# Patient Record
Sex: Female | Born: 1985 | Race: Black or African American | Hispanic: No | Marital: Married | Smoking: Never smoker
Health system: Southern US, Community
[De-identification: ages and names within clinical notes are randomized; demographics above are authoritative.]

## PROBLEM LIST (undated history)

## (undated) DIAGNOSIS — Z789 Other specified health status: Secondary | ICD-10-CM

---

## 2004-10-08 ENCOUNTER — Encounter: Admission: RE | Admit: 2004-10-08 | Discharge: 2004-10-08 | Payer: Self-pay | Admitting: Specialist

## 2005-10-04 ENCOUNTER — Ambulatory Visit (HOSPITAL_COMMUNITY): Admission: RE | Admit: 2005-10-04 | Discharge: 2005-10-04 | Payer: Self-pay | Admitting: *Deleted

## 2006-05-29 ENCOUNTER — Inpatient Hospital Stay (HOSPITAL_COMMUNITY): Admission: AD | Admit: 2006-05-29 | Discharge: 2006-06-03 | Payer: Self-pay | Admitting: Specialist

## 2006-05-29 ENCOUNTER — Ambulatory Visit: Payer: Self-pay | Admitting: Obstetrics and Gynecology

## 2006-05-30 ENCOUNTER — Encounter (INDEPENDENT_AMBULATORY_CARE_PROVIDER_SITE_OTHER): Payer: Self-pay | Admitting: Specialist

## 2006-06-05 ENCOUNTER — Ambulatory Visit: Payer: Self-pay | Admitting: Family Medicine

## 2007-08-06 IMAGING — CT CT PELVIS W/ CM
1 of 9 series · 11 of 32 positions shown, 17 images · IV contrast ([ID] READICAT & [ID] OMNIP 300%)
Comparison: No comparison films.

CLINICAL DATA: Status post C-section two days ago.  Distended abdomen with continued gross hematuria.
ABDOMEN CT WITH CONTRAST:
TECHNIQUE: Multidetector CT imaging of the abdomen was performed following the standard protocol during bolus administration of intravenous contrast.
Contrast:  099cc Omnipaque 300.
TECHNIQUE: Multidetector CT imaging of the pelvis was performed following the standard protocol during bolus administration of intravenous contrast.

[Series 4: recon 3: abd pelvis · axial · 0.72mm/px · z∈[-394,-74]mm · 11 of 385 slices shown, 17 images]
[im 33/385  soft-tissue]
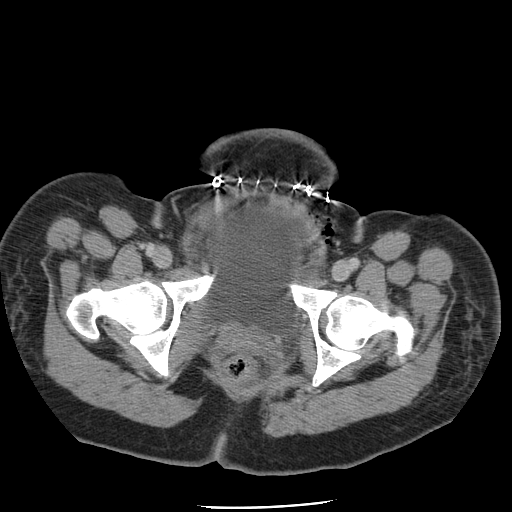
[im 33/385  bone]
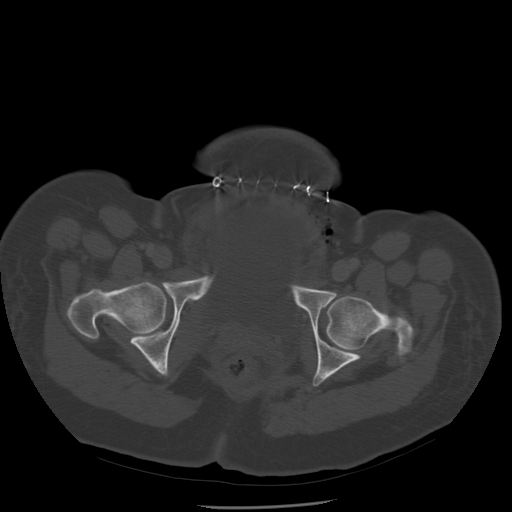
[im 65/385  soft-tissue]
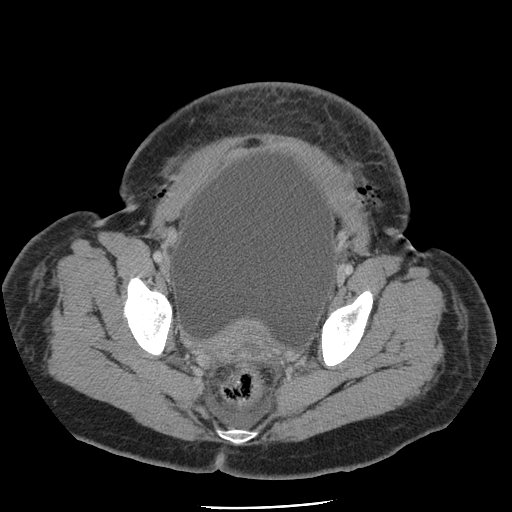
[im 97/385  soft-tissue]
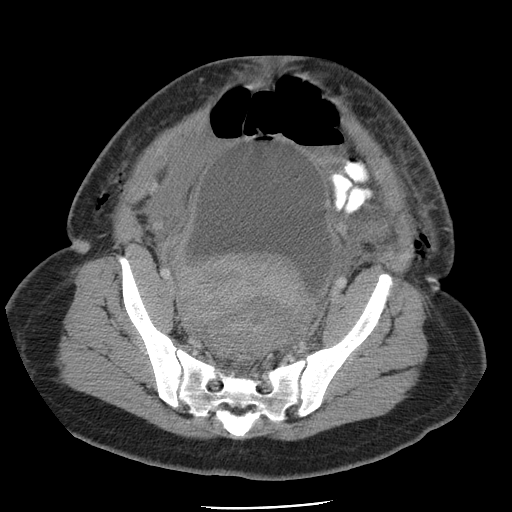
[im 129/385  soft-tissue]
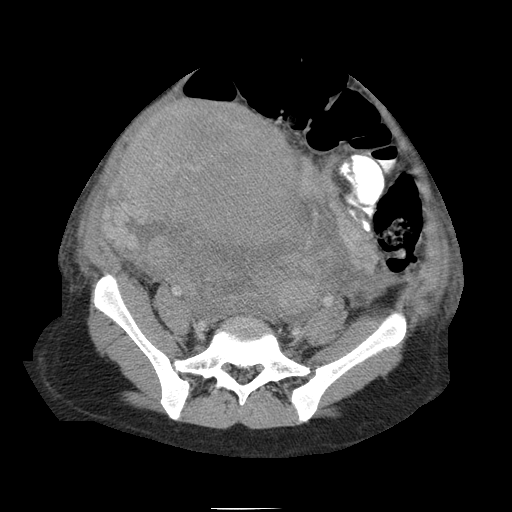
[im 161/385  soft-tissue]
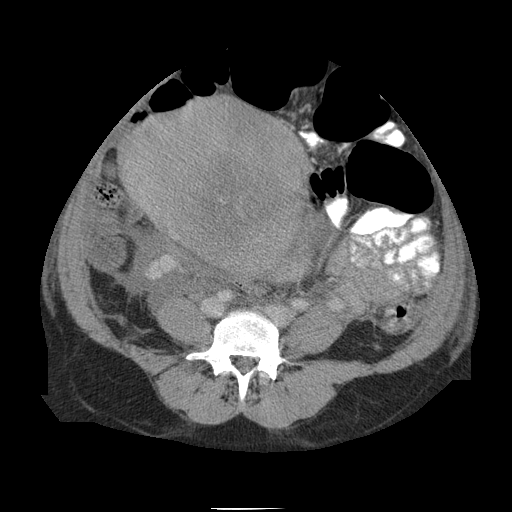
[im 193/385  soft-tissue]
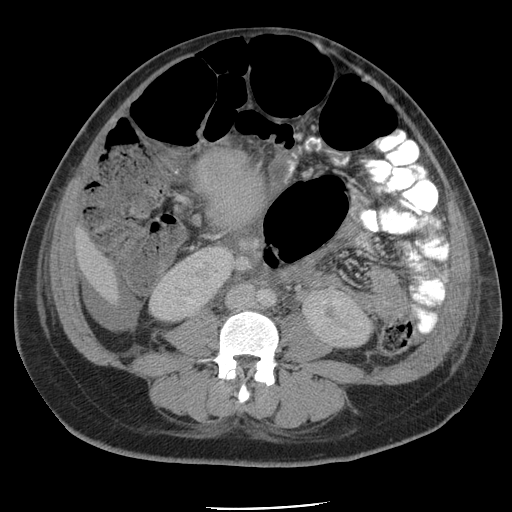
[im 225/385  soft-tissue]
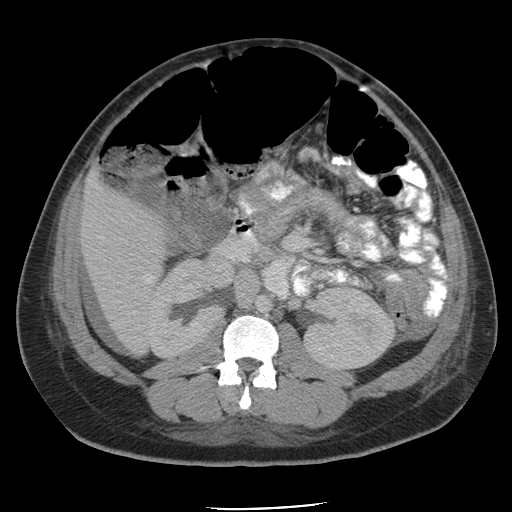
[im 257/385  soft-tissue]
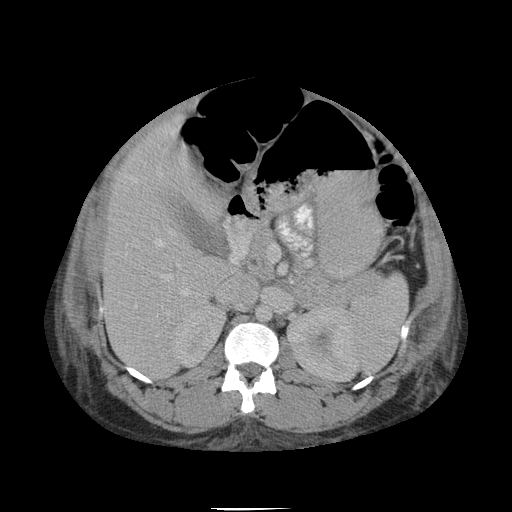
[im 257/385  lung]
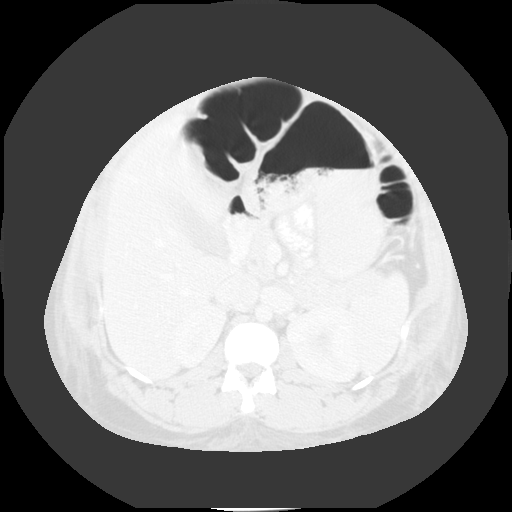
[im 289/385  soft-tissue]
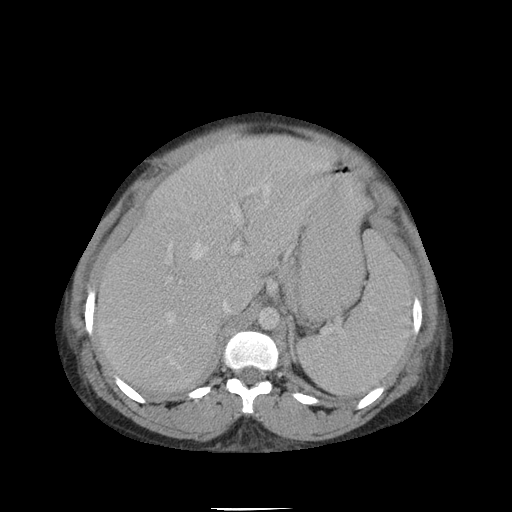
[im 289/385  lung]
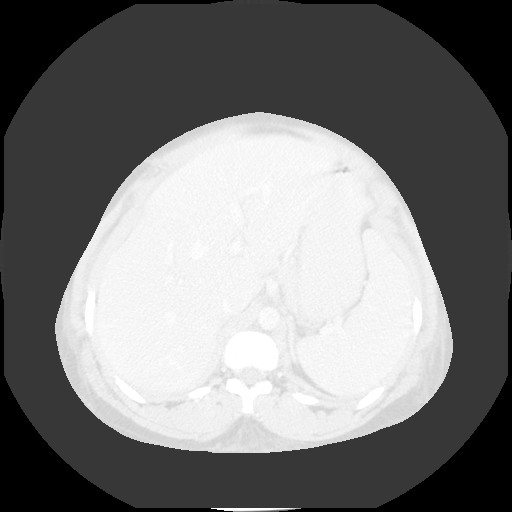
[im 289/385  bone]
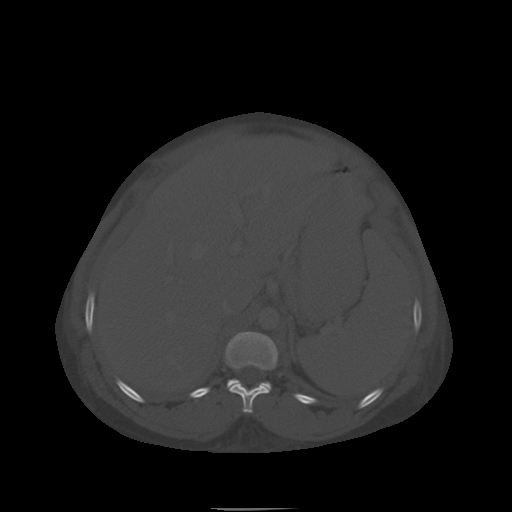
[im 321/385  soft-tissue]
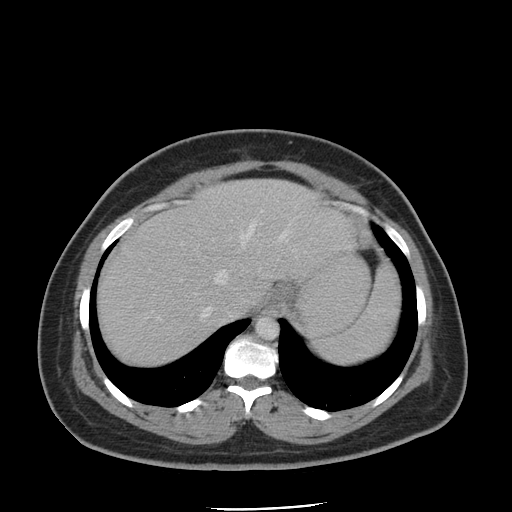
[im 321/385  lung]
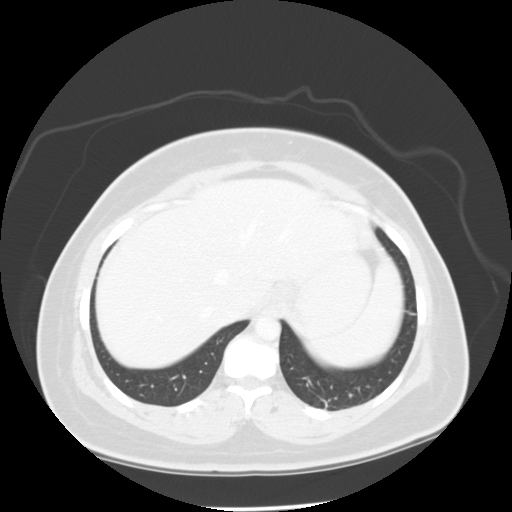
[im 353/385  soft-tissue]
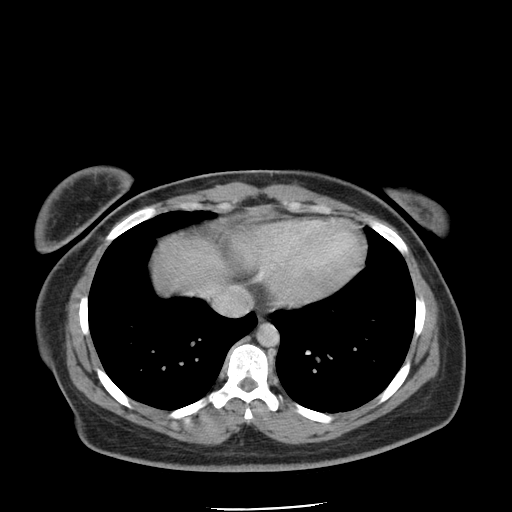
[im 353/385  lung]
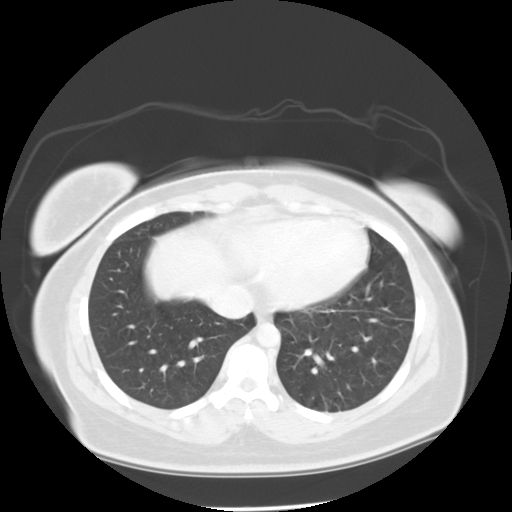

[11 of 32 positions shown; findings below may reference images not displayed]

FINDINGS: The liver, spleen, pancreas, adrenal glands and gallbladder are within normal limits.  Mild to moderate bilateral hydronephrosis and mild hydroureter is compatible with recent pregnancy.  There is a small amount of ascites present, likely postoperative.  A tiny amount of pneumoperitoneum is identified and again likely postoperative.  Enlarged heterogeneous uterus is compatible with recent pregnancy.  There is moderate gaseous distention of the mid colon compatible with ileus.  No dilated small bowel loops are identified.
IMPRESSION: 1. Colonic ileus. 
2. Small to moderate ascites and tiny amount of pneumoperitoneum compatible with recent C-section.
3. Mild to moderate bilateral hydronephrosis compatible with hydronephrosis of pregnancy.
PELVIS CT WITH CONTRAST:
FINDINGS: Enlarged heterogeneous uterus is identified.  Small amount of gas in the bladder is noted.  Contrast throughout the ureters and bladder is noted without extravasated urinary contrast on delayed images.  Gas in the subcutaneous tissues is compatible with recent C-section.  Small amount of free fluid in the pelvis is noted.
IMPRESSION: 1. No evidence of ureteral or bladder injury.  
2. Postsurgical changes within the pelvis, status post C-section.

## 2009-02-26 ENCOUNTER — Ambulatory Visit (HOSPITAL_COMMUNITY): Admission: AD | Admit: 2009-02-26 | Discharge: 2009-02-26 | Payer: Self-pay | Admitting: Obstetrics

## 2009-02-26 ENCOUNTER — Encounter (INDEPENDENT_AMBULATORY_CARE_PROVIDER_SITE_OTHER): Payer: Self-pay | Admitting: Obstetrics

## 2010-12-02 LAB — DIFFERENTIAL
Eosinophils Absolute: 0.2 10*3/uL (ref 0.0–0.7)
Eosinophils Relative: 2 % (ref 0–5)
Lymphocytes Relative: 33 % (ref 12–46)
Lymphs Abs: 2.7 10*3/uL (ref 0.7–4.0)
Monocytes Relative: 6 % (ref 3–12)

## 2010-12-02 LAB — CBC
HCT: 35.9 % — ABNORMAL LOW (ref 36.0–46.0)
MCV: 78.6 fL (ref 78.0–100.0)
Platelets: 193 10*3/uL (ref 150–400)
RBC: 4.57 MIL/uL (ref 3.87–5.11)
WBC: 8.2 10*3/uL (ref 4.0–10.5)

## 2011-01-11 NOTE — Discharge Summary (Signed)
NAMEBECKY, COLAN NO.:  [id number]   MEDICAL RECORD NO.:  [id number]          PATIENT TYPE:  INP   LOCATION:  9320                          FACILITY:  [facility]   PHYSICIAN:  Ginger Carne, MD  DATE OF BIRTH:  05-03-1986   DATE OF ADMISSION:  05/29/2006  DATE OF DISCHARGE:  06/03/2006                                 DISCHARGE SUMMARY   DISCHARGE DIAGNOSES:  1. Gravida 1, para1, status post primary low transverse cesarean section      secondary to failed induction for oligohydramnios, pre-eclampsia.  2. Pre-eclampsia.  3. Postpartum anemia.  4. Postop ileus.   CONSULTATIONS:  None.   PROCEDURE:  1. Biophysical profile and non stress test on 05/29/06 demonstrating BPP      of 8/8 and a nonreactive strip.  2. Amnio infusion.  3. Induction of labor with Pitocin.  4. Primary low transverse cesarean section on 05/30/06.  5. CT of the abdomen and pelvis with contrast on 06/01/06 demonstrating      colonic ileus, small to moderate ascites and tiny amount of      pneumoperitoneum compatible with recent C-section, mild to moderate      bilateral hydronephrosis compatible with recent pregnancy; no evidence      of ureteral or bladder injury, post surgical changes, status post C-      section noted.   ADMISSION AND PRENATAL LABS:  1. Blood type 0+.  2. Antibody screen negative.  3. Hemoglobin 11.8.  4. Platelets 202.  5. Rubella immune.  6. Hepatitis B surface antigen negative.  7. RPR nonreactive.  8. HIV declined.  9. GC negative.  10.Chlamydia negative.  11.GVS negative on 05/06/06.  12.One hour GCT of 126.   INPATIENT LABS:  1. Hemoglobin of 13.3 on admission and 8.2 on 05/31/06, following a C-      section, low of 6.4 on 06/02/06 and 6.8 on date of discharge.  2. Platelets 179 on admission, 143 on 05/31/06 and 188 at discharge.  3. Urine culture negative.  4. Urinalysis on 06/01/06 demonstrates large blood, urobilinogen 2.0, and      21-50 RBC's,  but otherwise within normal limits (cath specimen).  5. Creatinine 0.9 on 05/30/06.  6. AST 21 and ALT 10 on 05/30/06.  7. Alk phos 150 on 05/30/06.  8. Total bilirubin 1.3 on 05/30/06.  9. LDH 158 and uric acid 4.9 on 05/30/06.   ADMISSION HISTORY:  The patient is a 25 year old G1, now P2, who presented  at [redacted] weeks gestational age by LMP and six week scan with complaints of  contractions for the past three days with increasing intensity.  She denied  any vaginal bleeding or ruptured membranes and reported good fetal activity  at that time.  The patient was an [employer] patient.  She was found to be  in latent labor initially with cervical exam of 250 and -1 and with  reassuring fetal heart tracing.  She was having contractions every three  minutes, was otherwise doing well with blood pressure of 126/88 and afebrile  on admission.  She was observed in the MAU for several hours as her strip became somewhat  less reactive.  She was eventually admitted to Labor and Delivery for IV  fluids and for induction of labor, as she was also found to have  oligohydramnios on ultrasound.   HOSPITAL COURSE:  The patient had a protracted labor course as she had amnio  infusion and was induced on Pitocin.  Artificial rupture of membranes was  performed on 05/29/06 with light meconium and IUPC and FSC were placed  subsequently.  She then developed elevated blood pressures in the 150s to  160s over 90s to 100s on the morning of 05/30/06.  PIH labs and urine  protein levels were initiated and the patient was started on magnesium IV  for presumptive pre-eclampsia.  Pitocin induction was continued for several  more hours until the decision was made to proceed to C-section secondary to  fetal jeopardy and poor tolerance of labor by patient; she had been making  poor cervical change and her contractions were not adequate with the  dysfunctional pattern, baby was suspected to be OP and deflexed.   The  patient was taken to the operating room and primary low transverse  cesarean section was performed without difficulty. Please see Dr. Serita Kyle  completed dictated op note for complete details.   Following delivery, patient appeared medically stable throughout her  postpartum course. She did have some complaints of abdominal pain and  abdominal distention and had some hematuria noted, but otherwise felt fine  and was breastfeeding well without difficulty. She ambulated immediately and  was completely asymptomatic with regards to her anemia. The patient was  found to have a hemoglobin of 8.2 the morning after delivery and her  hemoglobin continued to drop over the ensuing two days with a low of 6.4 on  06/02/06, but had begun to rise on the date of discharge. The patient did  not desire a blood transfusion and as she was completely asymptomatic from  her anemia, the decision was to simply monitor her hemoglobin levels and  monitor her symptoms. She was discharged on twice daily iron and will be  seen in clinic in three days for staple removal, at which time she will also  be reassessed for any signs or symptoms of anemia or hypokalemia.   The patient was also suffering from moderate abdominal distention secondary  to bowel gas.  Her abdomen remained tympanitic on physical exam  postoperatively, even at the time of discharge but was significantly  improved after initiating Dulcolax suppositories in addition to her Colace  and Senna. The patient was passing flatus without difficulty for the last  three days prior to discharge and was able to pass a small bowel movement on  the day of discharge.  She did have some postop ileus noted on the CT scan,  as well, but her abdominal exam was otherwise benign and was not felt to be  in need of further evaluation at the time of discharge by Dr. Mia Creek.  The patient was noted to have some hematuria postoperatively and while she  had a Foley in place,  however this continued even after the Foley was  removed and further studies were carried out.  Urinalysis of repeat cath  sample demonstrated large blood and elevated urobilinogen, although the  sample was not grossly bloody as her previous urine samples had been.  Due  to concern for possible injury to the bladder or ureters during her C-  section,  CT of the abdomen and pelvis with contrast was obtained per  recommendation by radiology to assess for possible damage to these  structures and to also assess for possible causes for abdominal bleeding in  light of her worsening anemia.  CT scan results are as detailed above.  Some  free fluid was noted in the CT scan, but was not consistent with fresh  bleeding and was felt most likely to be residual irrigation from the  procedure that had not been completely suctioned out.  It is mostly located  in the right pericolic gutter as the patient was clinically asymptomatic and  stable, a decision was not to proceed with further workup.   On the date of discharge, the patient did spike a temperature of 100.7 in  the morning, but was without other complaints. Repeat temperature checks  later in the day, revealed her temperature to have dropped back to within  normal limits and she was felt to be appropriate for discharge.  She was not  discharged on any antibiotics as her white count was normal and she was not  having other symptoms.   The patient will return to clinic here on Friday in three days for removal  of her surgical staples and will be reassessed with regards to her anemia  and hematuria.  She will then follow-up in six weeks for her routine  postpartum follow-up.   DISCHARGE CONDITION:  Fair.   DISPOSITION:  The patient is discharged to home with her female infant.   DISCHARGE MEDICATIONS:  1. Ibuprofen 600 mg p.o. q. 6 hours p.r.n. pain.  2. Percocet 5/325 mg one tab p.o. q. 4-6 hours p.r.n. breakthrough pain,      dispense number  20 with no refills.  3. Colace 100 mg p.o. twice daily p.r.n. constipation.  4. Iron sulfate 325 mg one tab by mouth twice daily between meals.  5. Prenatal vitamins one tab p.o. daily.   DISCHARGE INSTRUCTIONS:  The patient is to resume activity as tolerated but  to exercise pelvic rest for the next six weeks.   FOLLOWUP:  The patient is to follow-up in [facility] here on Friday,  06/06/06 for staple removal and will have routine postpartum follow-up at  [facility] in six weeks.  She will use condoms/abstinence for  contraception for the time being.     ______________________________  Carollee Herter, DO      Ginger Carne, MD  Electronically Signed    EC/MEDQ  D:  06/03/2006  T:  06/05/2006  Job:  [id number]

## 2011-01-11 NOTE — Op Note (Signed)
[name], Stefanie Hernandez               ACCOUNT NO.:  [id number]   MEDICAL RECORD NO.:  [id number]          PATIENT TYPE:  INP   LOCATION:  9167                          FACILITY:  [facility]   PHYSICIAN:  Phil D. Okey Dupre, M.D.     DATE OF BIRTH:  27-Jun-1986   DATE OF PROCEDURE:  05/30/2006  DATE OF DISCHARGE:                                 OPERATIVE REPORT   PROCEDURE:  Low-transverse cesarean section.   PREOPERATIVE DIAGNOSIS:  Fetal jeopardy and preeclampsia.   POSTOPERATIVE DIAGNOSIS:  Fetal jeopardy and preeclampsia.   SURGEON:  Javier Glazier. Okey Dupre, M.D.   FIRST ASSISTANT:  Carollee Herter, DO.   ANESTHESIA:  Epidural.   SPECIMENS:  Placenta to pathology.   POSTOPERATIVE CONDITION:  Satisfactory.   INDICATIONS FOR PROCEDURE:  The patient is a 25 year old primigravida at  full dilatation with 4+ caput and direct posterior who began having  significant decelerations with pushing and slower recovery.  It was decided  to do a cesarean section at that point.   DESCRIPTION OF PROCEDURE:  Under satisfactory epidural anesthesia with the  patient in the dorsal supine position and a Foley catheter in urinary  bladder, the abdomen was prepped and draped in usual sterile manner and  entered through a transverse incision situated 3 cm above the symphysis  pubis and extended for a total length of 18 cm.  The abdomen was entered by  layers.  On entering the peritoneal cavity, the visceral peritoneum on the  anterior surface of the uterus was opened transversely by sharp dissection.  The bladder was pushed away the lower uterine segment and was entered by  sharp and blunt dissection.  From a direct OP presentation, the baby was  easily delivered and had a cord pH of 7.21.  The cord was doubly clamped and  divided, and the baby was handed to the pediatrician.  Samples of blood were  taken from the cord for analysis.  The placenta was spontaneously removed.  The uterus was explored and closed with continuous  running locked 0 Vicryl  suture on an atraumatic needle followed by an imbricating suture of the same  type of suture.  The area was observed for bleeding.  The ovaries and tubes  were normal, and the fascia was closed with continuous running 0 Vicryl on  an atraumatic needle.  The skin and subcutaneous bleeders were controlled  with hot cautery.  Dry sterile dressings were applied after the skin edges  were approximated with skin staples.  The patient had a total blood loss of  approximately 500 mL.   She tolerated the procedure well and was transferred to the recovery room in  satisfactory condition, with a Foley catheter draining clear amber urine at  the end of the procedure.           ______________________________  Javier Glazier. Okey Dupre, M.D.     PDR/MEDQ  D:  05/30/2006  T:  06/01/2006  Job:  [id number]

## 2011-02-10 ENCOUNTER — Other Ambulatory Visit: Payer: Self-pay | Admitting: Obstetrics & Gynecology

## 2011-02-10 ENCOUNTER — Inpatient Hospital Stay (HOSPITAL_COMMUNITY)
Admission: AD | Admit: 2011-02-10 | Discharge: 2011-02-13 | DRG: 766 | Disposition: A | Discharge status: Home / Self Care | Payer: Medicaid Other | Source: Ambulatory Visit | Attending: Obstetrics | Admitting: Obstetrics

## 2011-02-10 DIAGNOSIS — O34219 Maternal care for unspecified type scar from previous cesarean delivery: Secondary | ICD-10-CM | POA: Diagnosis present

## 2011-02-10 LAB — CBC
HCT: 32.6 % — ABNORMAL LOW (ref 36.0–46.0)
MCH: 26.3 pg (ref 26.0–34.0)
MCV: 71.3 fL — ABNORMAL LOW (ref 78.0–100.0)
Platelets: 175 10*3/uL (ref 150–400)
RBC: 4.57 MIL/uL (ref 3.87–5.11)
RDW: 15.5 % (ref 11.5–15.5)
WBC: 8.6 10*3/uL (ref 4.0–10.5)

## 2011-02-11 LAB — CBC
Hemoglobin: 9.9 g/dL — ABNORMAL LOW (ref 12.0–15.0)
MCH: 25.8 pg — ABNORMAL LOW (ref 26.0–34.0)
MCHC: 36 g/dL (ref 30.0–36.0)
MCV: 71.6 fL — ABNORMAL LOW (ref 78.0–100.0)
RBC: 3.84 MIL/uL — ABNORMAL LOW (ref 3.87–5.11)

## 2011-02-13 NOTE — Discharge Summary (Signed)
  NAMEVANILLA, HEATHERINGTON NO.:  [id number]  MEDICAL RECORD NO.:  [id number]  LOCATION:  9101                          [facility]ACILITY:  [facility]  PHYSICIAN:  Kathreen Cosier, M.D.DATE OF BIRTH:  Oct 04, 1985  DATE OF ADMISSION:  02/10/2011 DATE OF DISCHARGE:  02/13/2011                              DISCHARGE SUMMARY   The patient is a 25 year old gravida 3, para 1-0-1-1 with a previous C- section.  Her due date was February 22, 2011 and she was admitted in labor and underwent repeat low transverse cesarean section because of failure to progress in labor.  Postoperatively, she did well.  Her hemoglobin was 9.9.  HIV negative, RPR negative.  She was discharged on the third postoperative day, ambulatory, on a regular diet, to see me in 6 weeks.  DISCHARGE DIAGNOSIS:  Status post repeat low transverse cesarean section at term.          ______________________________ Kathreen Cosier, M.D.     BAM/MEDQ  D:  02/13/2011  T:  02/13/2011  Job:  [id number]  Electronically Signed by Francoise Ceo M.D. on 02/13/2011 09:01:49 AM

## 2011-02-18 NOTE — Op Note (Signed)
NAMEAMYRE, Stefanie Hernandez NO.:  [id number]  MEDICAL RECORD NO.:  [id number]  LOCATION:  9101                          [facility]ILITY[facility]  PHYSICIAN:  Roseanna Rainbow, M.D.DATE OF BIRTH:  [birth year]  DATE OF PROCEDURE:  02/10/2011 DATE OF DISCHARGE:                              OPERATIVE REPORT   PREOPERATIVE DIAGNOSES:  Intrauterine pregnancy at 38 plus weeks, history of previous cesarean delivery, early labor, category II fetal heart tracing.  POSTOPERATIVE DIAGNOSES:  Intrauterine pregnancy at 38 plus weeks, history of previous cesarean delivery, early labor, category II fetal heart tracing, uterine scar dehiscence.  PROCEDURE:  Repeat low uterine flap elliptical cesarean delivery via transverse incision.  SURGEON:  Roseanna Rainbow, MD  ANESTHESIA:  Spinal.  PATHOLOGY:  Placenta.  ESTIMATED BLOOD LOSS:  600 mL.  COMPLICATIONS:  None.  IV FLUIDS AND URINE OUTPUT:  As per Anesthesiology.  FINDINGS:  At laparotomy, there was a window in the serosa overlying the previous scar at the left margin approximately 2 cm in length.  The amniotic fluid was meconium stained with particulate matter.  The placenta and membranes were stained as well.  Umbilical artery pH was 7.30.  DESCRIPTION OF PROCEDURE:  The patient was taken to the operating room with an IV running.  A Foley catheter was then placed.  A spinal anesthetic was administered.  She was placed in the dorsal supine position with a leftward tilt and prepped and draped in the usual sterile fashion.  After time-out had been completed the previous scar was incised with the scalpel.  This incision was extended down to the fascia.  The fascia was nicked in the midline.  The fascial incision was then extended bilaterally.  The superior aspect of the fascial incision was tented up and the underlying rectus muscles dissected off.  The inferior aspect of the fascial incision was manipulated in a  similar fashion.  The rectus muscles were separated in the midline.  The parietal peritoneum was tented up and entered sharply.  This incision was then extended superiorly and inferiorly with good visualization of the bladder.  The bladder blade was then placed.  The above-noted window was then probed and opened with a hemostat.  The remainder of the uterine incision was opened bluntly.  The infant's head was delivered atraumatically.  The cord was clamped and cut.  The infant was handed off to the awaiting neonatologist.  The placenta was then removed.  The intrauterine cavity was evacuated of any remaining amniotic fluid, clots, and debris with moist laparotomy sponge.  Please note that umbilical artery pH was sent.  The intrauterine cavity was evacuated of any remaining amniotic fluid, clots, and debris with moist laparotomy sponge.  The bladder flap was then created using sharp and blunt dissection.  The uterine incision was then reapproximated in a running interlocking fashion using a suture of 0 Monocryl.  A second imbricating layer of the same suture was placed.  Adequate hemostasis was noted. The paracolic gutters were then irrigated.  The parietal peritoneum was closed in a running fashion using a suture of 2-0 Vicryl.  The fascia was closed with two running sutures of O PDS  tied in the midline.  The skin was closed in a subcuticular fashion using a suture of 3-0 Monocryl.  At the close of the procedure, the instrument and pack counts were said to be correct x2.  The patient was taken to the PACU awake and in stable condition.     Roseanna Rainbow, M.D.     Stefanie Hernandez  D:  02/10/2011  T:  02/10/2011  Job:  [id number]  Electronically Signed by Antionette Char M.D. on 02/18/2011 10:55:08 AM

## 2011-02-18 NOTE — H&P (Signed)
  Stefanie Hernandez, HEMRICK NO.:  [id number]  MEDICAL RECORD NO.:  [id number]  LOCATION:  9101                          [facility]ILITY[facility]  [facility]  PHYSICIAN:  Roseanna Rainbow, M.D.DATE OF BIRTH:  Jul 06, 1986  DATE OF ADMISSION:  02/10/2011 DATE OF DISCHARGE:                             HISTORY & PHYSICAL   CHIEF COMPLAINT:  The patient is a 25 year old, para 1 with a history of previous cesarean delivery complaining of rupture of membranes and contractions.  HISTORY OF PRESENT ILLNESS:  Please see the above.  SOCIAL HISTORY:  She is single.  She denies any tobacco, ethanol, or drug use.  PAST GYNECOLOGICAL HISTORY:  Normal triad.  History of a female circumcision.  PAST OBSTETRIC HISTORY:  In 2006, she was delivered of a live born female with birth weight 8 pounds and 4 ounces, full term, cesarean delivery.  The indication was CPD.  There is a history of a spontaneous abortion.  PAST SURGICAL HISTORY:  Please see the above past medical history. History of a female circumcision.  FAMILY HISTORY:  Noncontributory.  MEDICATIONS:  Please see the medication reconciliation form.  ALLERGIES:  No known drug allergies.  ANTEPARTUM COURSE:  Prenatal care with Dr. Gaynell Hernandez, onset of care at 14 weeks.  PRENATAL LABORATORY DATA:  Hemoglobin 11.4, hematocrit 32, and platelets 205,000.  Blood type O positive, antibody screen negative, RPR nonreactive, rubella immune.  Hepatitis B surface antigen negative, HIV nonreactive.  Pap test negative.  GC and Chlamydia negative.  Quad screen negative.  One-hour GTT 104, RPR nonreactive.  Ultrasound at 16 weeks and 2 days, anterior placenta, no previa.  REVIEW OF SYSTEMS:  GU:  Please see the above.  PHYSICAL EXAMINATION:  VITAL SIGNS:  Stable, afebrile.  Fetal heart tracing baseline 140s.  Moderate long-term variability.  Accelerations present.  Severe variable decelerations, several and prolonged approximately 2 minutes in  duration with recovery.  No reflex tachycardia. GENERAL:  Very uncomfortable. GU:  Sterile vaginal exam, cervix is loose 2, 60% effaced, fetal scalp electrode and IUPC then placed.  ASSESSMENT:  Primipara at term, history of a previous cesarean delivery, early labor, category 2 fetal heart tracing.  Fetal heart tracing likely secondary to cord compression, cannot rule out signs of possible uterine rupture.  PLAN:  Admission, repeat cesarean delivery.  Informed consent was obtained.     Roseanna Rainbow, M.D.     [name]  D:  02/10/2011  T:  02/10/2011  Job:  [id number]  Electronically Signed by Antionette Char M.D. on 02/18/2011 10:55:29 AM

## 2012-01-31 LAB — OB RESULTS CONSOLE ANTIBODY SCREEN: Antibody Screen: NEGATIVE

## 2012-01-31 LAB — OB RESULTS CONSOLE ABO/RH: RH Type: POSITIVE

## 2012-01-31 LAB — OB RESULTS CONSOLE HEPATITIS B SURFACE ANTIGEN: Hepatitis B Surface Ag: NEGATIVE

## 2012-07-07 ENCOUNTER — Other Ambulatory Visit: Payer: Self-pay | Admitting: Obstetrics

## 2012-07-25 ENCOUNTER — Encounter (HOSPITAL_COMMUNITY): Payer: Self-pay | Admitting: Pharmacist

## 2012-07-29 ENCOUNTER — Encounter (HOSPITAL_COMMUNITY)
Admission: RE | Admit: 2012-07-29 | Discharge: 2012-07-29 | Disposition: A | Discharge status: Home / Self Care | Payer: Medicaid Other | Source: Ambulatory Visit | Attending: Obstetrics | Admitting: Obstetrics

## 2012-07-29 ENCOUNTER — Encounter (HOSPITAL_COMMUNITY): Payer: Self-pay

## 2012-07-29 HISTORY — DX: Other specified health status: Z78.9

## 2012-07-29 LAB — DIFFERENTIAL
Basophils Absolute: 0 10*3/uL (ref 0.0–0.1)
Basophils Relative: 0 % (ref 0–1)
Eosinophils Absolute: 0.1 10*3/uL (ref 0.0–0.7)
Eosinophils Relative: 2 % (ref 0–5)
Monocytes Absolute: 0.5 10*3/uL (ref 0.1–1.0)
Monocytes Relative: 6 % (ref 3–12)
Neutro Abs: 4.7 10*3/uL (ref 1.7–7.7)

## 2012-07-29 LAB — TYPE AND SCREEN

## 2012-07-29 LAB — CBC
HCT: 31.3 % — ABNORMAL LOW (ref 36.0–46.0)
Hemoglobin: 11.1 g/dL — ABNORMAL LOW (ref 12.0–15.0)
MCH: 24.6 pg — ABNORMAL LOW (ref 26.0–34.0)
MCHC: 35.5 g/dL (ref 30.0–36.0)
RDW: 16.9 % — ABNORMAL HIGH (ref 11.5–15.5)

## 2012-07-29 NOTE — H&P (Signed)
Stefanie Hernandez, Stefanie Hernandez NO.:  [id number]  MEDICAL RECORD NO.:  [id number]  LOCATION:  PERIO                         [facility]ILITY:  [facility]  PHYSICIAN:  Kathreen Cosier, M.D.DATE OF BIRTH:  1986-04-06  DATE OF ADMISSION:  05/15/2012 DATE OF DISCHARGE:                             HISTORY & PHYSICAL   DATE OF OPERATION:  August 05, 2012.  The patient is a 26 year old, gravida 4, para 2-0-1-2, [facility] August 08, 2012.  She has had 2 previous C-sections, 1 spontaneous abortion.  This pregnancy has been uncomplicated except for positive GBS and she is in for repeat C-section.  PAST MEDICAL HISTORY:  Negative.  PAST SURGICAL HISTORY:  C-section x2.  SOCIAL HISTORY:  Noncontributory.  FAMILY HISTORY:  Noncontributory.  SYSTEM REVIEW:  Negative.  PHYSICAL EXAMINATION:  GENERAL:  Well-developed female in no distress. HEENT:  Negative. LUNGS:  Clear to P and A. BREASTS:  Engorged. HEART:  Regular rhythm.  No murmurs, no gallops. LUNGS:  Clear. ABDOMEN:  Term. PELVIC:  Deferred. EXTREMITIES:  Negative.          ______________________________ Kathreen Cosier, M.D.     BAM/MEDQ  D:  07/29/2012  T:  07/29/2012  Job:  [id number]

## 2012-07-29 NOTE — Patient Instructions (Addendum)
20 Stefanie Hernandez  07/29/2012   Your procedure is scheduled on:  08/05/12  Enter through the Main Entrance of [facility] at 11AM   Pick up the phone at the desk and dial [phone].   Call this number if you have problems the morning of surgery: [phone]   Remember:   Do not eat food:After Midnight.  Do not drink clear liquids: After Midnight.  Take these medicines the morning of surgery with A SIP OF WATER: NA   Do not wear jewelry, make-up or nail polish.  Do not wear lotions, powders, or perfumes. You may wear deodorant.  Do not shave 48 hours prior to surgery.  Do not bring valuables to the hospital.  Contacts, dentures or bridgework may not be worn into surgery.  Leave suitcase in the car. After surgery it may be brought to your room.  For patients admitted to the hospital, checkout time is 11:00 AM the day of discharge.   Patients discharged the day of surgery will not be allowed to drive home.  Name and phone number of your driver: NA  Special Instructions: Shower using CHG 2 nights before surgery and the night before surgery.  If you shower the day of surgery use CHG.  Use special wash - you have one bottle of CHG for all showers.  You should use approximately 1/3 of the bottle for each shower.   Please read over the following fact sheets that you were given: MRSA Information

## 2012-08-05 ENCOUNTER — Encounter (HOSPITAL_COMMUNITY): Payer: Self-pay | Admitting: *Deleted

## 2012-08-05 ENCOUNTER — Inpatient Hospital Stay (HOSPITAL_COMMUNITY): Payer: Medicaid Other | Admitting: Anesthesiology

## 2012-08-05 ENCOUNTER — Inpatient Hospital Stay (HOSPITAL_COMMUNITY)
Admission: AD | Admit: 2012-08-05 | Discharge: 2012-08-08 | DRG: 766 | Disposition: A | Discharge status: Home / Self Care | Payer: Medicaid Other | Source: Ambulatory Visit | Attending: Obstetrics | Admitting: Obstetrics

## 2012-08-05 ENCOUNTER — Encounter (HOSPITAL_COMMUNITY): Payer: Self-pay | Admitting: Anesthesiology

## 2012-08-05 ENCOUNTER — Encounter (HOSPITAL_COMMUNITY)
Admission: AD | Disposition: A | Discharge status: Home / Self Care | Payer: Self-pay | Source: Ambulatory Visit | Attending: Obstetrics

## 2012-08-05 DIAGNOSIS — O99892 Other specified diseases and conditions complicating childbirth: Secondary | ICD-10-CM | POA: Diagnosis present

## 2012-08-05 DIAGNOSIS — Z22330 Carrier of Group B streptococcus: Secondary | ICD-10-CM

## 2012-08-05 DIAGNOSIS — O34219 Maternal care for unspecified type scar from previous cesarean delivery: Principal | ICD-10-CM | POA: Diagnosis present

## 2012-08-05 LAB — TYPE AND SCREEN
ABO/RH(D): O POS
Antibody Screen: NEGATIVE

## 2012-08-05 SURGERY — Surgical Case
Anesthesia: Spinal | Site: Abdomen | Wound class: Clean Contaminated

## 2012-08-05 MED ORDER — MIDAZOLAM HCL 2 MG/2ML IJ SOLN: 0.5000 mg | Freq: Once | INTRAMUSCULAR | Status: DC | PRN

## 2012-08-05 MED ORDER — DIBUCAINE 1 % RE OINT: 1.0000 "application " | TOPICAL_OINTMENT | RECTAL | Status: DC | PRN

## 2012-08-05 MED ORDER — MENTHOL 3 MG MT LOZG: 1.0000 | LOZENGE | OROMUCOSAL | Status: DC | PRN

## 2012-08-05 MED ORDER — DIPHENHYDRAMINE HCL 50 MG/ML IJ SOLN: 12.5000 mg | INTRAMUSCULAR | Status: DC | PRN

## 2012-08-05 MED ORDER — PRENATAL MULTIVITAMIN CH
1.0000 | ORAL_TABLET | Freq: Every day | ORAL | Status: DC
  Administered 2012-08-06 – 2012-08-08 (×3): 1 via ORAL
  Filled 2012-08-05 (×3): qty 1

## 2012-08-05 MED ORDER — NALOXONE HCL 0.4 MG/ML IJ SOLN: 0.4000 mg | INTRAMUSCULAR | Status: DC | PRN

## 2012-08-05 MED ORDER — FENTANYL CITRATE 0.05 MG/ML IJ SOLN
INTRAMUSCULAR | Status: DC | PRN
  Administered 2012-08-05: 25 ug via INTRATHECAL

## 2012-08-05 MED ORDER — SENNOSIDES-DOCUSATE SODIUM 8.6-50 MG PO TABS
2.0000 | ORAL_TABLET | Freq: Every day | ORAL | Status: DC
  Administered 2012-08-05 – 2012-08-07 (×3): 2 via ORAL

## 2012-08-05 MED ORDER — NALBUPHINE SYRINGE 5 MG/0.5 ML
5.0000 mg | INJECTION | INTRAMUSCULAR | Status: DC | PRN
  Filled 2012-08-05: qty 1

## 2012-08-05 MED ORDER — LACTATED RINGERS IV SOLN
INTRAVENOUS | Status: DC
  Administered 2012-08-05: 21:00:00 via INTRAVENOUS

## 2012-08-05 MED ORDER — OXYTOCIN 10 UNIT/ML IJ SOLN
40.0000 [IU] | INTRAVENOUS | Status: DC | PRN
  Administered 2012-08-05: 40 [IU] via INTRAVENOUS

## 2012-08-05 MED ORDER — ONDANSETRON HCL 4 MG PO TABS: 4.0000 mg | ORAL_TABLET | ORAL | Status: DC | PRN

## 2012-08-05 MED ORDER — SODIUM CHLORIDE 0.9 % IJ SOLN: 3.0000 mL | INTRAMUSCULAR | Status: DC | PRN

## 2012-08-05 MED ORDER — FENTANYL CITRATE 0.05 MG/ML IJ SOLN
25.0000 ug | INTRAMUSCULAR | Status: DC | PRN
  Administered 2012-08-05: 100 ug via INTRAVENOUS
  Administered 2012-08-05: 50 ug via INTRAVENOUS

## 2012-08-05 MED ORDER — SIMETHICONE 80 MG PO CHEW
80.0000 mg | CHEWABLE_TABLET | Freq: Three times a day (TID) | ORAL | Status: DC
  Administered 2012-08-05 – 2012-08-08 (×10): 80 mg via ORAL

## 2012-08-05 MED ORDER — NALOXONE HCL 1 MG/ML IJ SOLN
1.0000 ug/kg/h | INTRAVENOUS | Status: DC | PRN
  Filled 2012-08-05: qty 2

## 2012-08-05 MED ORDER — ONDANSETRON HCL 4 MG/2ML IJ SOLN
INTRAMUSCULAR | Status: DC | PRN
  Administered 2012-08-05: 4 mg via INTRAVENOUS

## 2012-08-05 MED ORDER — PHENYLEPHRINE 40 MCG/ML (10ML) SYRINGE FOR IV PUSH (FOR BLOOD PRESSURE SUPPORT)
PREFILLED_SYRINGE | INTRAVENOUS | Status: AC
  Filled 2012-08-05: qty 10

## 2012-08-05 MED ORDER — PROMETHAZINE HCL 25 MG/ML IJ SOLN: 6.2500 mg | INTRAMUSCULAR | Status: DC | PRN

## 2012-08-05 MED ORDER — CEFAZOLIN SODIUM-DEXTROSE 2-3 GM-% IV SOLR
2.0000 g | Freq: Once | INTRAVENOUS | Status: AC
  Administered 2012-08-05: 2 g via INTRAVENOUS

## 2012-08-05 MED ORDER — DIPHENHYDRAMINE HCL 50 MG/ML IJ SOLN: 25.0000 mg | INTRAMUSCULAR | Status: DC | PRN

## 2012-08-05 MED ORDER — OXYTOCIN 10 UNIT/ML IJ SOLN
INTRAMUSCULAR | Status: AC
  Filled 2012-08-05: qty 4

## 2012-08-05 MED ORDER — METOCLOPRAMIDE HCL 5 MG/ML IJ SOLN: 10.0000 mg | Freq: Three times a day (TID) | INTRAMUSCULAR | Status: DC | PRN

## 2012-08-05 MED ORDER — MORPHINE SULFATE 0.5 MG/ML IJ SOLN
INTRAMUSCULAR | Status: AC
  Filled 2012-08-05: qty 10

## 2012-08-05 MED ORDER — OXYTOCIN 40 UNITS IN LACTATED RINGERS INFUSION - SIMPLE MED: 62.5000 mL/h | INTRAVENOUS | Status: AC

## 2012-08-05 MED ORDER — MEPERIDINE HCL 25 MG/ML IJ SOLN: 6.2500 mg | INTRAMUSCULAR | Status: DC | PRN

## 2012-08-05 MED ORDER — IBUPROFEN 600 MG PO TABS
600.0000 mg | ORAL_TABLET | Freq: Four times a day (QID) | ORAL | Status: DC
  Administered 2012-08-05 – 2012-08-08 (×10): 600 mg via ORAL
  Filled 2012-08-05 (×10): qty 1

## 2012-08-05 MED ORDER — ACETAMINOPHEN 10 MG/ML IV SOLN
1000.0000 mg | Freq: Four times a day (QID) | INTRAVENOUS | Status: AC | PRN
  Filled 2012-08-05: qty 100

## 2012-08-05 MED ORDER — INFLUENZA VIRUS VACC SPLIT PF IM SUSP
0.5000 mL | INTRAMUSCULAR | Status: AC
  Administered 2012-08-06: 0.5 mL via INTRAMUSCULAR

## 2012-08-05 MED ORDER — DIPHENHYDRAMINE HCL 25 MG PO CAPS: 25.0000 mg | ORAL_CAPSULE | Freq: Four times a day (QID) | ORAL | Status: DC | PRN

## 2012-08-05 MED ORDER — PHENYLEPHRINE HCL 10 MG/ML IJ SOLN
INTRAMUSCULAR | Status: DC | PRN
  Administered 2012-08-05: 120 ug via INTRAVENOUS
  Administered 2012-08-05: 80 ug via INTRAVENOUS
  Administered 2012-08-05: 40 ug via INTRAVENOUS
  Administered 2012-08-05 (×3): 80 ug via INTRAVENOUS
  Administered 2012-08-05: 120 ug via INTRAVENOUS

## 2012-08-05 MED ORDER — SCOPOLAMINE 1 MG/3DAYS TD PT72: 1.0000 | MEDICATED_PATCH | Freq: Once | TRANSDERMAL | Status: DC

## 2012-08-05 MED ORDER — KETOROLAC TROMETHAMINE 30 MG/ML IJ SOLN
30.0000 mg | Freq: Four times a day (QID) | INTRAMUSCULAR | Status: AC | PRN
  Administered 2012-08-05: 30 mg via INTRAMUSCULAR

## 2012-08-05 MED ORDER — FENTANYL CITRATE 0.05 MG/ML IJ SOLN
INTRAMUSCULAR | Status: AC
  Filled 2012-08-05: qty 2

## 2012-08-05 MED ORDER — ONDANSETRON HCL 4 MG/2ML IJ SOLN
INTRAMUSCULAR | Status: AC
  Filled 2012-08-05: qty 2

## 2012-08-05 MED ORDER — ONDANSETRON HCL 4 MG/2ML IJ SOLN: 4.0000 mg | Freq: Three times a day (TID) | INTRAMUSCULAR | Status: DC | PRN

## 2012-08-05 MED ORDER — MORPHINE SULFATE (PF) 0.5 MG/ML IJ SOLN
INTRAMUSCULAR | Status: DC | PRN
  Administered 2012-08-05: .15 mg via INTRATHECAL

## 2012-08-05 MED ORDER — TETANUS-DIPHTH-ACELL PERTUSSIS 5-2.5-18.5 LF-MCG/0.5 IM SUSP
0.5000 mL | Freq: Once | INTRAMUSCULAR | Status: AC
  Administered 2012-08-06: 0.5 mL via INTRAMUSCULAR
  Filled 2012-08-05: qty 0.5

## 2012-08-05 MED ORDER — ZOLPIDEM TARTRATE 5 MG PO TABS: 5.0000 mg | ORAL_TABLET | Freq: Every evening | ORAL | Status: DC | PRN

## 2012-08-05 MED ORDER — FENTANYL CITRATE 0.05 MG/ML IJ SOLN
INTRAMUSCULAR | Status: AC
  Administered 2012-08-05: 100 ug via INTRAVENOUS
  Filled 2012-08-05: qty 2

## 2012-08-05 MED ORDER — LACTATED RINGERS IV SOLN
INTRAVENOUS | Status: DC
  Administered 2012-08-05 (×3): via INTRAVENOUS

## 2012-08-05 MED ORDER — DIPHENHYDRAMINE HCL 25 MG PO CAPS
25.0000 mg | ORAL_CAPSULE | ORAL | Status: DC | PRN
  Filled 2012-08-05: qty 1

## 2012-08-05 MED ORDER — FENTANYL CITRATE 0.05 MG/ML IJ SOLN
INTRAMUSCULAR | Status: DC | PRN
  Administered 2012-08-05: 25 ug via INTRAVENOUS

## 2012-08-05 MED ORDER — LANOLIN HYDROUS EX OINT: 1.0000 "application " | TOPICAL_OINTMENT | CUTANEOUS | Status: DC | PRN

## 2012-08-05 MED ORDER — KETOROLAC TROMETHAMINE 30 MG/ML IJ SOLN: 30.0000 mg | Freq: Four times a day (QID) | INTRAMUSCULAR | Status: AC | PRN

## 2012-08-05 MED ORDER — SIMETHICONE 80 MG PO CHEW
80.0000 mg | CHEWABLE_TABLET | ORAL | Status: DC | PRN
  Administered 2012-08-06: 80 mg via ORAL

## 2012-08-05 MED ORDER — OXYCODONE-ACETAMINOPHEN 5-325 MG PO TABS
1.0000 | ORAL_TABLET | ORAL | Status: DC | PRN
  Administered 2012-08-06: 2 via ORAL
  Administered 2012-08-06 – 2012-08-07 (×6): 1 via ORAL
  Administered 2012-08-07: 2 via ORAL
  Administered 2012-08-07: 1 via ORAL
  Administered 2012-08-08: 2 via ORAL
  Filled 2012-08-05 (×5): qty 1
  Filled 2012-08-05 (×2): qty 2
  Filled 2012-08-05 (×2): qty 1
  Filled 2012-08-05: qty 2

## 2012-08-05 MED ORDER — SCOPOLAMINE 1 MG/3DAYS TD PT72
1.0000 | MEDICATED_PATCH | TRANSDERMAL | Status: DC
  Administered 2012-08-05: 1.5 mg via TRANSDERMAL

## 2012-08-05 MED ORDER — WITCH HAZEL-GLYCERIN EX PADS: 1.0000 "application " | MEDICATED_PAD | CUTANEOUS | Status: DC | PRN

## 2012-08-05 MED ORDER — SCOPOLAMINE 1 MG/3DAYS TD PT72
MEDICATED_PATCH | TRANSDERMAL | Status: AC
  Filled 2012-08-05: qty 1

## 2012-08-05 MED ORDER — CEFAZOLIN SODIUM-DEXTROSE 2-3 GM-% IV SOLR
INTRAVENOUS | Status: AC
  Filled 2012-08-05: qty 50

## 2012-08-05 MED ORDER — SCOPOLAMINE 1 MG/3DAYS TD PT72
MEDICATED_PATCH | TRANSDERMAL | Status: AC
  Administered 2012-08-05: 1.5 mg via TRANSDERMAL
  Filled 2012-08-05: qty 1

## 2012-08-05 MED ORDER — ONDANSETRON HCL 4 MG/2ML IJ SOLN: 4.0000 mg | INTRAMUSCULAR | Status: DC | PRN

## 2012-08-05 MED ORDER — LACTATED RINGERS IV SOLN
INTRAVENOUS | Status: DC | PRN
  Administered 2012-08-05: 13:00:00 via INTRAVENOUS

## 2012-08-05 SURGICAL SUPPLY — 24 items
CLOTH BEACON ORANGE TIMEOUT ST (SAFETY) ×2 IMPLANT
DERMABOND ADVANCED (GAUZE/BANDAGES/DRESSINGS) ×1
DERMABOND ADVANCED .7 DNX12 (GAUZE/BANDAGES/DRESSINGS) ×1 IMPLANT
DRAPE LG THREE QUARTER DISP (DRAPES) ×2 IMPLANT
DRSG OPSITE POSTOP 4X10 (GAUZE/BANDAGES/DRESSINGS) ×2 IMPLANT
DURAPREP 26ML APPLICATOR (WOUND CARE) ×2 IMPLANT
ELECT REM PT RETURN 9FT ADLT (ELECTROSURGICAL) ×2
ELECTRODE REM PT RTRN 9FT ADLT (ELECTROSURGICAL) ×1 IMPLANT
GLOVE BIO SURGEON STRL SZ8.5 (GLOVE) ×4 IMPLANT
GOWN BRE IMP PREV XXLGXLNG (GOWN DISPOSABLE) ×2 IMPLANT
GOWN PREVENTION PLUS LG XLONG (DISPOSABLE) ×2 IMPLANT
GOWN STRL REIN XL XLG (GOWN DISPOSABLE) ×2 IMPLANT
NS IRRIG 1000ML POUR BTL (IV SOLUTION) ×2 IMPLANT
PACK C SECTION WH (CUSTOM PROCEDURE TRAY) ×2 IMPLANT
PAD OB MATERNITY 4.3X12.25 (PERSONAL CARE ITEMS) ×2 IMPLANT
SUT CHROMIC 0 CT 802H (SUTURE) ×2 IMPLANT
SUT CHROMIC 1 CTX 36 (SUTURE) ×6 IMPLANT
SUT CHROMIC 2 0 SH (SUTURE) ×2 IMPLANT
SUT MON AB 4-0 PS1 27 (SUTURE) ×2 IMPLANT
SUT VIC AB 0 CTX 36 (SUTURE) ×2
SUT VIC AB 0 CTX36XBRD ANBCTRL (SUTURE) ×2 IMPLANT
TOWEL OR 17X24 6PK STRL BLUE (TOWEL DISPOSABLE) ×6 IMPLANT
TRAY FOLEY CATH 14FR (SET/KITS/TRAYS/PACK) ×2 IMPLANT
WATER STERILE IRR 1000ML POUR (IV SOLUTION) ×2 IMPLANT

## 2012-08-05 NOTE — Anesthesia Preprocedure Evaluation (Signed)

## 2012-08-05 NOTE — H&P (Signed)
  There has been no change in the patient's history and physical since her dictation was done

## 2012-08-05 NOTE — Anesthesia Postprocedure Evaluation (Signed)
  Anesthesia Post-op Note  Patient: Stefanie Hernandez  Procedure(s) Performed: Procedure(s) (LRB) with comments: CESAREAN SECTION (N/A)  Patient is awake, responsive, moving her legs, and has signs of resolution of her numbness. Pain and nausea are reasonably well controlled. Vital signs are stable and clinically acceptable. Oxygen saturation is clinically acceptable. There are no apparent anesthetic complications at this time. Patient is ready for discharge.

## 2012-08-05 NOTE — Transfer of Care (Signed)
Immediate Anesthesia Transfer of Care Note  Patient: Stefanie Hernandez  Procedure(s) Performed: Procedure(s) (LRB) with comments: CESAREAN SECTION (N/A)  Patient Location: PACU  Anesthesia Type:Spinal  Level of Consciousness: awake, alert  and oriented  Airway & Oxygen Therapy: Patient Spontanous Breathing  Post-op Assessment: Report given to PACU RN  Post vital signs: Reviewed and stable  Complications: No apparent anesthesia complications

## 2012-08-05 NOTE — Op Note (Signed)
.  diagnosisprevious cesarean sectionat term for repeat Postop diagnosis is same Surgeon Dr. Francoise Ceo First assistant Dr. Coral Ceo Procedure patient placed in the operating table in the supine position after the day of spinal was administered a transverse suprapubic incision made through the old scar and carried him to the rectus fascia the fascia cleaned and incised the length of the incision the recti muscles retracted laterally peritoneum incised longitudinally a transverse incision made on the visceroperitoneum above the bladder and the bladder mobilized inferiorly transverse low uterine incision made the fluid was clear patient delivered from the LOA position of a female Apgar 9 and 9 the placenta was fundal removed manually and sent to labor and delivery uterine cavity clean with dry laps the uterine incision closed in one layer with continuous looped abnormal one chromic hemostasis was satisfactory bladder flap reattached to a chromic uterus well contracted abdomen closed in layers peritoneum continuous with 2-0 chromic fascia continuous suture of 0 Vicryl and the skin shows a subcuticular stitch of 4-0 Monocryl blood loss was 800 cc patient tolerated the procedure well taken to recovery room in good condition

## 2012-08-05 NOTE — Anesthesia Procedure Notes (Signed)

## 2012-08-06 LAB — CBC
MCH: 24.3 pg — ABNORMAL LOW (ref 26.0–34.0)
MCHC: 35.3 g/dL (ref 30.0–36.0)
Platelets: 144 10*3/uL — ABNORMAL LOW (ref 150–400)
RDW: 17 % — ABNORMAL HIGH (ref 11.5–15.5)

## 2012-08-06 MED ORDER — BISACODYL 10 MG RE SUPP
10.0000 mg | Freq: Once | RECTAL | Status: AC
  Administered 2012-08-06: 10 mg via RECTAL
  Filled 2012-08-06: qty 1

## 2012-08-06 NOTE — Anesthesia Postprocedure Evaluation (Signed)
  Anesthesia Post-op Note  Patient: Antionette Char  Procedure(s) Performed: Procedure(s) (LRB) with comments: CESAREAN SECTION (N/A)  Patient Location: Mother/Baby  Anesthesia Type:Regional  Level of Consciousness: awake, alert  and oriented  Airway and Oxygen Therapy: Patient Spontanous Breathing  Post-op Pain: mild  Post-op Assessment: Patient's Cardiovascular Status Stable, Respiratory Function Stable, No signs of Nausea or vomiting, Pain level controlled, No headache, No backache, No residual numbness and No residual motor weakness  Post-op Vital Signs: stable  Complications: No apparent anesthesia complications

## 2012-08-06 NOTE — Progress Notes (Signed)
UR completed 

## 2012-08-06 NOTE — Addendum Note (Signed)
Addendum  created 08/06/12 0902 by Earmon Phoenix, CRNA   Modules edited:Notes Section

## 2012-08-06 NOTE — Progress Notes (Signed)
Patient ID: Stefanie Hernandez, female   DOB: 05/21/1986, 26 y.o.   MRN: [id number] Postop day 1 Vital signs normal Incision clean and dry Lochia moderate Legs negative No complaints

## 2012-08-07 ENCOUNTER — Encounter (HOSPITAL_COMMUNITY): Payer: Self-pay | Admitting: Obstetrics

## 2012-08-07 NOTE — Progress Notes (Signed)
Patient ID: Stefanie Hernandez, female   DOB: [birth year], 26 y.o.   MRN: [id number] Postop day 2 Vital signs normal Incision clean and dry Lochia moderate Legs negative doing well

## 2012-08-08 NOTE — Discharge Summary (Signed)
Obstetric Discharge Summary Reason for Admission: cesarean section Prenatal Procedures: none Intrapartum Procedures: cesarean: low cervical, transverse Postpartum Procedures: none Complications-Operative and Postpartum: none Hemoglobin  Date Value Range Status  08/06/2012 9.5* 12.0 - 15.0 g/dL Final     HCT  Date Value Range Status  08/06/2012 26.9* 36.0 - 46.0 % Final    Physical Exam:  General: alert Lochia: appropriate Uterine Fundus: firm Incision: healing well DVT Evaluation: No evidence of DVT seen on physical exam.  Discharge Diagnoses: Term Pregnancy-delivered  Discharge Information: Date: 08/08/2012 Activity: pelvic rest Diet: routine Medications: Percocet Condition: stable Instructions: refer to practice specific booklet Discharge to: home Follow-up Information    Call in 6 weeks to follow up.         Newborn Data: Live born female  Birth Weight: 7 lb 7.4 oz (3385 g) APGAR: 9, 9  Home with mother.  Chyane Greer A 08/08/2012, 7:27 AM

## 2012-08-10 ENCOUNTER — Telehealth (HOSPITAL_COMMUNITY): Payer: Self-pay | Admitting: *Deleted

## 2012-08-10 NOTE — Telephone Encounter (Signed)
Resolve episode 

## 2013-01-21 ENCOUNTER — Encounter (HOSPITAL_COMMUNITY): Payer: Self-pay | Admitting: *Deleted

## 2013-01-21 ENCOUNTER — Inpatient Hospital Stay (HOSPITAL_COMMUNITY)
Admission: AD | Admit: 2013-01-21 | Discharge: 2013-01-22 | Disposition: A | Discharge status: Home / Self Care | Payer: Self-pay | Source: Ambulatory Visit | Attending: Family Medicine | Admitting: Family Medicine

## 2013-01-21 DIAGNOSIS — T8339XA Other mechanical complication of intrauterine contraceptive device, initial encounter: Secondary | ICD-10-CM | POA: Insufficient documentation

## 2013-01-21 DIAGNOSIS — N949 Unspecified condition associated with female genital organs and menstrual cycle: Secondary | ICD-10-CM | POA: Insufficient documentation

## 2013-01-21 DIAGNOSIS — N921 Excessive and frequent menstruation with irregular cycle: Secondary | ICD-10-CM

## 2013-01-21 DIAGNOSIS — R109 Unspecified abdominal pain: Secondary | ICD-10-CM | POA: Insufficient documentation

## 2013-01-21 DIAGNOSIS — N938 Other specified abnormal uterine and vaginal bleeding: Secondary | ICD-10-CM | POA: Insufficient documentation

## 2013-01-21 MED ORDER — KETOROLAC TROMETHAMINE 60 MG/2ML IM SOLN
60.0000 mg | Freq: Once | INTRAMUSCULAR | Status: AC
  Administered 2013-01-21: 60 mg via INTRAMUSCULAR
  Filled 2013-01-21: qty 2

## 2013-01-21 NOTE — MAU Note (Addendum)
PT SAYS  [facility] PUT IUD ON 11-06-2012-   SAYS SHE HAS BEEN BLEEDING  SINCE THEN -- THEY GAVE HER MEDS.  BUT SAYS HER  BLEEDING AND PAIN IS WORSE AND SHE CAN'T FIND THE STRING.  SHE CALLED THEM- TOLD HER TO TAKE IBUPROFEN  AND BCP - DID NOT HELP.   SHE WENT TO [facility]  TODAY- AFTERNOON- THEY COULDN'T  DECIDE IF IUD STILL THERRE.  LAST SEX- LAST YEAR.

## 2013-01-21 NOTE — MAU Note (Signed)
Pt had IUD placed 11/06/2012, continues to have vaginal bleeding and cramping.  Pt took 5 days of oral contraceptive to try and stop bleeding, no relief.  Unable to find IUD string.

## 2013-01-21 NOTE — MAU Provider Note (Signed)
History     CSN: [id number]  Arrival date and time: 01/21/13 1845   First Provider Initiated Contact with Patient 01/21/13 2301      Chief Complaint  Patient presents with  . Vaginal Bleeding  . Abdominal Cramping   HPI This is a 27 y.o. female who presents with c/o bleeding from IUD. Has had it since March. Was given 5 OCPs a month ago and bleeding did not stop. They gave her a new Rx for them today. States they could not find the string today and tried to take her IUD out, but could not get it out. She is "very worried that they will not be able to get it out and need to do surgery".   Breastfeeding infant  RN Note: PT SAYS HD PUT IUD ON 11-06-2012- SAYS SHE HAS BEEN BLEEDING SINCE THEN -- THEY GAVE HER MEDS. BUT SAYS HER BLEEDING AND PAIN IS WORSE AND SHE CAN'T FIND THE STRING. SHE CALLED THEM- TOLD HER TO TAKE IBUPROFEN AND BCP - DID NOT HELP. SHE WENT TO HD TODAY- AFTERNOON- THEY COULDN'T DECIDE IF IUD STILL THERRE. LAST SEX- LAST YEAR.  Revision History...     OB History   Grav Para Term Preterm Abortions TAB SAB Ect Mult Living   4 3 3  1     3       Past Medical History  Diagnosis Date  . No pertinent past medical history     Past Surgical History  Procedure Laterality Date  . Cesarean section    . Cesarean section  08/05/2012    Procedure: CESAREAN SECTION;  Surgeon: Kathreen Cosier, MD;  Location: [facility];  Service: Obstetrics;  Laterality: N/A;    History reviewed. No pertinent family history.  History  Substance Use Topics  . Smoking status: Never Smoker   . Smokeless tobacco: Not on file  . Alcohol Use: No    Allergies: No Known Allergies  Prescriptions prior to admission  Medication Sig Dispense Refill  . acetaminophen (TYLENOL) 325 MG tablet Take 650 mg by mouth every 6 (six) hours as needed for pain (For headaches.).      Marland Kitchen Prenatal Vit-Fe Fumarate-FA (PRENATAL MULTIVITAMIN) TABS Take 1 tablet by mouth daily at 12 noon.        Review of  Systems  Constitutional: Negative for fever, chills and malaise/fatigue.  Gastrointestinal: Positive for abdominal pain (some cramping). Negative for nausea, vomiting, diarrhea and constipation.  Genitourinary: Negative for dysuria.       Vaginal bleeding   Neurological: Negative for headaches.   Physical Exam   Blood pressure 120/79, pulse 88, temperature 98.4 F (36.9 C), temperature source Oral, resp. rate 16, height 5' (1.524 m), weight 66.951 kg (147 lb 9.6 oz), last menstrual period 10/16/2012, currently breastfeeding.  Physical Exam  Constitutional: She is oriented to person, place, and time. She appears well-developed and well-nourished. No distress.  HENT:  Head: Normocephalic.  Cardiovascular: Normal rate.   Respiratory: Effort normal.  GI: Soft. She exhibits no distension. There is tenderness (mild tenderness over uterus). There is no rebound and no guarding.  Genitourinary: Uterus normal. Vaginal discharge (small blood) found.  Unable to see string  Musculoskeletal: Normal range of motion.  Neurological: She is alert and oriented to person, place, and time.  Skin: Skin is warm and dry.  Psychiatric: She has a normal mood and affect.    MAU Course  Procedures  MDM US Transvaginal Non-ob  01/22/2013   *RADIOLOGY REPORT*  Clinical Data: Unable to find intrauterine device string.  TRANSABDOMINAL AND TRANSVAGINAL ULTRASOUND OF PELVIS Technique:  Both transabdominal and transvaginal ultrasound examinations of the pelvis were performed. Transabdominal technique was performed for global imaging of the pelvis including uterus, ovaries, adnexal regions, and pelvic cul-de-sac.  It was necessary to proceed with endovaginal exam following the transabdominal exam to visualize the uterus and ovaries in greater detail.  Comparison:  Pelvic ultrasound performed 02/26/2009, and CT of the abdomen and pelvis performed 06/01/2006  Findings:  Uterus: Normal in size and appearance; measures 7.2  x 3.8 x 5.4 cm.  Endometrium: The intrauterine device appears to be mildly malpositioned, with one of the prongs extending into the myometrium at the left cornu, though the majority of the device is seen along the endometrial canal.  The strings of the intrauterine device appear to be flipped into the endometrial canal.  Right ovary:  Normal appearance/no adnexal mass; measures 3.9 x 2.8 x 1.7 cm.  Left ovary: Normal appearance/no adnexal mass; measures 3.4 x 2.7 x 2.1 cm.  Other findings: Trace free fluid is seen in the pelvic cul-de-sac.  IMPRESSION: 1.  Intrauterine device appears to be mildly malpositioned, with one of the prongs seen extending into the myometrium at the left cornu, though the majority of the device is seen along the endometrial canal.  The strings of the intrauterine device appear to be flipped into the endometrial canal. 2.  Otherwise unremarkable pelvic ultrasound.   Original Report Authenticated By: Tonia Ghent, M.D.    Assessment and Plan  A:  BLeeding with Mirena IUD      Slightly malpositioned IUD      Strings flipped inside uterus  P:  Reassured bleeding is normal in first 3-6 months of Mirena IUD      Continue plan with OCPs as [facility]       Reassured her if she decided to remove IUD, they will find someone who is able to get it out.    [facility] 01/21/2013, 11:05 PM

## 2013-01-22 ENCOUNTER — Inpatient Hospital Stay (HOSPITAL_COMMUNITY): Payer: Self-pay

## 2013-01-22 ENCOUNTER — Encounter (HOSPITAL_COMMUNITY): Payer: Self-pay | Admitting: Advanced Practice Midwife

## 2013-01-22 DIAGNOSIS — Z975 Presence of (intrauterine) contraceptive device: Secondary | ICD-10-CM

## 2013-01-22 DIAGNOSIS — N921 Excessive and frequent menstruation with irregular cycle: Secondary | ICD-10-CM

## 2013-01-22 NOTE — Discharge Instructions (Signed)

## 2013-01-22 NOTE — MAU Provider Note (Signed)
Chart reviewed and agree with management and plan.  

## 2014-06-27 ENCOUNTER — Encounter (HOSPITAL_COMMUNITY): Payer: Self-pay | Admitting: Advanced Practice Midwife

## 2015-11-09 LAB — OB RESULTS CONSOLE ANTIBODY SCREEN: Antibody Screen: NEGATIVE

## 2015-11-09 LAB — OB RESULTS CONSOLE ABO/RH: RH Type: POSITIVE

## 2015-11-09 LAB — OB RESULTS CONSOLE RUBELLA ANTIBODY, IGM: Rubella: IMMUNE

## 2015-11-09 LAB — OB RESULTS CONSOLE RPR: RPR: NONREACTIVE

## 2015-11-09 LAB — OB RESULTS CONSOLE HEPATITIS B SURFACE ANTIGEN: Hepatitis B Surface Ag: NEGATIVE

## 2015-11-09 LAB — OB RESULTS CONSOLE GC/CHLAMYDIA
CHLAMYDIA, DNA PROBE: NEGATIVE
Gonorrhea: NEGATIVE

## 2015-11-09 LAB — OB RESULTS CONSOLE HIV ANTIBODY (ROUTINE TESTING): HIV: NONREACTIVE

## 2015-11-30 ENCOUNTER — Ambulatory Visit (HOSPITAL_COMMUNITY): Payer: Medicaid Other

## 2016-01-23 ENCOUNTER — Encounter (HOSPITAL_COMMUNITY): Payer: Self-pay | Admitting: Family Medicine

## 2016-03-05 ENCOUNTER — Encounter (HOSPITAL_COMMUNITY): Payer: Self-pay | Admitting: *Deleted

## 2016-03-19 LAB — OB RESULTS CONSOLE GBS: GBS: NEGATIVE

## 2016-03-27 ENCOUNTER — Encounter (HOSPITAL_COMMUNITY): Payer: Self-pay | Admitting: *Deleted

## 2016-04-02 NOTE — Patient Instructions (Signed)
20 Stefanie Hernandez  04/02/2016   Your procedure is scheduled on:  04/04/2016  [facility] 0[facility] AM.  Pick up the phone at the desk and dial [phone].   Call this number if you have problems the morning of surgery: [phone]   Remember:   Do not eat food:After Midnight.  Do not drink clear liquids: After Midnight.  Take these medicines the morning of surgery with A SIP OF WATER: none   Do not wear jewelry, make-up or nail polish.  Do not wear lotions, powders, or perfumes. You may wear deodorant.  Do not shave 48 hours prior to surgery.  Do not bring valuables to the hospital.  [facility] is not   responsible for any belongings or valuables brought to the hospital.  Contacts, dentures or bridgework may not be worn into surgery.  Leave suitcase in the car. After surgery it may be brought to your room.  For patients admitted to the hospital, checkout time is 11:00 AM the day of              discharge.   Patients discharged the day of surgery will not be allowed to drive             home.  Name and phone number of your driver: na  Special Instructions:   N/A   Please read over the following fact sheets that you were given:   Surgical Site Infection Prevention

## 2016-04-03 ENCOUNTER — Encounter (HOSPITAL_COMMUNITY)
Admission: RE | Admit: 2016-04-03 | Discharge: 2016-04-03 | Disposition: A | Discharge status: Home / Self Care | Payer: Medicaid Other | Source: Ambulatory Visit | Attending: Obstetrics & Gynecology | Admitting: Obstetrics & Gynecology

## 2016-04-04 ENCOUNTER — Inpatient Hospital Stay (HOSPITAL_COMMUNITY): Payer: Medicaid Other | Admitting: Anesthesiology

## 2016-04-04 ENCOUNTER — Encounter (HOSPITAL_COMMUNITY)
Admission: RE | Disposition: A | Discharge status: Home / Self Care | Payer: Self-pay | Source: Ambulatory Visit | Attending: Obstetrics & Gynecology

## 2016-04-04 ENCOUNTER — Inpatient Hospital Stay (HOSPITAL_COMMUNITY)
Admission: RE | Admit: 2016-04-04 | Discharge: 2016-04-06 | DRG: 766 | Disposition: A | Discharge status: Home / Self Care | Payer: Medicaid Other | Source: Ambulatory Visit | Attending: Obstetrics & Gynecology | Admitting: Obstetrics & Gynecology

## 2016-04-04 ENCOUNTER — Encounter (HOSPITAL_COMMUNITY): Payer: Self-pay

## 2016-04-04 DIAGNOSIS — Z3A39 39 weeks gestation of pregnancy: Secondary | ICD-10-CM

## 2016-04-04 DIAGNOSIS — O34211 Maternal care for low transverse scar from previous cesarean delivery: Principal | ICD-10-CM | POA: Diagnosis present

## 2016-04-04 DIAGNOSIS — Z98891 History of uterine scar from previous surgery: Secondary | ICD-10-CM

## 2016-04-04 DIAGNOSIS — Z302 Encounter for sterilization: Secondary | ICD-10-CM | POA: Diagnosis not present

## 2016-04-04 HISTORY — DX: History of uterine scar from previous surgery: Z98.891

## 2016-04-04 LAB — RPR: RPR Ser Ql: NONREACTIVE

## 2016-04-04 LAB — CBC
HEMATOCRIT: 30.6 % — AB (ref 36.0–46.0)
HEMATOCRIT: 28.3 % — AB (ref 36.0–46.0)
HEMOGLOBIN: 10.3 g/dL — AB (ref 12.0–15.0)
Hemoglobin: 11 g/dL — ABNORMAL LOW (ref 12.0–15.0)
MCH: 25.2 pg — AB (ref 26.0–34.0)
MCH: 25.2 pg — ABNORMAL LOW (ref 26.0–34.0)
MCHC: 35.9 g/dL (ref 30.0–36.0)
MCHC: 36.4 g/dL — AB (ref 30.0–36.0)
MCV: 70 fL — AB (ref 78.0–100.0)
MCV: 69.4 fL — ABNORMAL LOW (ref 78.0–100.0)
PLATELETS: 146 10*3/uL — AB (ref 150–400)
Platelets: 147 10*3/uL — ABNORMAL LOW (ref 150–400)
RBC: 4.37 MIL/uL (ref 3.87–5.11)
RBC: 4.08 MIL/uL (ref 3.87–5.11)
RDW: 17.1 % — AB (ref 11.5–15.5)
RDW: 17 % — ABNORMAL HIGH (ref 11.5–15.5)
WBC: 6.8 10*3/uL (ref 4.0–10.5)
WBC: 8 10*3/uL (ref 4.0–10.5)

## 2016-04-04 LAB — TYPE AND SCREEN
ABO/RH(D): O POS
Antibody Screen: NEGATIVE

## 2016-04-04 SURGERY — Surgical Case
Anesthesia: Spinal | Laterality: Bilateral | Wound class: Clean Contaminated

## 2016-04-04 MED ORDER — ONDANSETRON HCL 4 MG/2ML IJ SOLN: 4.0000 mg | Freq: Three times a day (TID) | INTRAMUSCULAR | Status: DC | PRN

## 2016-04-04 MED ORDER — NALOXONE HCL 2 MG/2ML IJ SOSY
1.0000 ug/kg/h | PREFILLED_SYRINGE | INTRAVENOUS | Status: DC | PRN
  Filled 2016-04-04: qty 2

## 2016-04-04 MED ORDER — NALOXONE HCL 0.4 MG/ML IJ SOLN: 0.4000 mg | INTRAMUSCULAR | Status: DC | PRN

## 2016-04-04 MED ORDER — SIMETHICONE 80 MG PO CHEW
80.0000 mg | CHEWABLE_TABLET | ORAL | Status: DC | PRN
  Filled 2016-04-04: qty 1

## 2016-04-04 MED ORDER — FENTANYL CITRATE (PF) 100 MCG/2ML IJ SOLN
25.0000 ug | INTRAMUSCULAR | Status: DC | PRN
  Administered 2016-04-04: 50 ug via INTRAVENOUS
  Administered 2016-04-04: 25 ug via INTRAVENOUS

## 2016-04-04 MED ORDER — MORPHINE SULFATE-NACL 0.5-0.9 MG/ML-% IV SOSY
PREFILLED_SYRINGE | INTRAVENOUS | Status: AC
  Filled 2016-04-04: qty 1

## 2016-04-04 MED ORDER — DEXAMETHASONE SODIUM PHOSPHATE 10 MG/ML IJ SOLN
INTRAMUSCULAR | Status: DC | PRN
  Administered 2016-04-04: 4 mg via INTRAVENOUS

## 2016-04-04 MED ORDER — DEXAMETHASONE SODIUM PHOSPHATE 4 MG/ML IJ SOLN
INTRAMUSCULAR | Status: AC
  Filled 2016-04-04: qty 1

## 2016-04-04 MED ORDER — OXYTOCIN 40 UNITS IN LACTATED RINGERS INFUSION - SIMPLE MED: 2.5000 [IU]/h | INTRAVENOUS | Status: AC

## 2016-04-04 MED ORDER — ACETAMINOPHEN 325 MG PO TABS: 650.0000 mg | ORAL_TABLET | ORAL | Status: DC | PRN

## 2016-04-04 MED ORDER — BUPIVACAINE HCL (PF) 0.5 % IJ SOLN
INTRAMUSCULAR | Status: DC | PRN
  Administered 2016-04-04: 30 mL

## 2016-04-04 MED ORDER — NALBUPHINE HCL 10 MG/ML IJ SOLN: 5.0000 mg | INTRAMUSCULAR | Status: DC | PRN

## 2016-04-04 MED ORDER — LACTATED RINGERS IV SOLN
INTRAVENOUS | Status: DC
  Administered 2016-04-04: 09:00:00 via INTRAVENOUS

## 2016-04-04 MED ORDER — MENTHOL 3 MG MT LOZG: 1.0000 | LOZENGE | OROMUCOSAL | Status: DC | PRN

## 2016-04-04 MED ORDER — LACTATED RINGERS IV SOLN
INTRAVENOUS | Status: DC | PRN
  Administered 2016-04-04: 10:00:00 via INTRAVENOUS

## 2016-04-04 MED ORDER — ACETAMINOPHEN 500 MG PO TABS
ORAL_TABLET | ORAL | Status: AC
  Administered 2016-04-04: 1000 mg via ORAL
  Filled 2016-04-04: qty 2

## 2016-04-04 MED ORDER — ZOLPIDEM TARTRATE 5 MG PO TABS: 5.0000 mg | ORAL_TABLET | Freq: Every evening | ORAL | Status: DC | PRN

## 2016-04-04 MED ORDER — CEFAZOLIN SODIUM-DEXTROSE 2-4 GM/100ML-% IV SOLN
2.0000 g | Freq: Once | INTRAVENOUS | Status: AC
  Administered 2016-04-04: 2 g via INTRAVENOUS
  Filled 2016-04-04: qty 100

## 2016-04-04 MED ORDER — NALBUPHINE HCL 10 MG/ML IJ SOLN: 5.0000 mg | Freq: Once | INTRAMUSCULAR | Status: DC | PRN

## 2016-04-04 MED ORDER — SOD CITRATE-CITRIC ACID 500-334 MG/5ML PO SOLN
30.0000 mL | Freq: Once | ORAL | Status: AC
  Administered 2016-04-04: 30 mL via ORAL

## 2016-04-04 MED ORDER — SOD CITRATE-CITRIC ACID 500-334 MG/5ML PO SOLN
ORAL | Status: AC
  Administered 2016-04-04: 30 mL via ORAL
  Filled 2016-04-04: qty 15

## 2016-04-04 MED ORDER — IBUPROFEN 600 MG PO TABS
600.0000 mg | ORAL_TABLET | Freq: Four times a day (QID) | ORAL | Status: DC
  Administered 2016-04-04 – 2016-04-06 (×8): 600 mg via ORAL
  Filled 2016-04-04 (×8): qty 1

## 2016-04-04 MED ORDER — PRENATAL MULTIVITAMIN CH
1.0000 | ORAL_TABLET | Freq: Every day | ORAL | Status: DC
  Administered 2016-04-05 – 2016-04-06 (×2): 1 via ORAL
  Filled 2016-04-04 (×2): qty 1

## 2016-04-04 MED ORDER — MAGNESIUM HYDROXIDE 400 MG/5ML PO SUSP
30.0000 mL | ORAL | Status: DC | PRN
  Administered 2016-04-05: 30 mL via ORAL
  Filled 2016-04-04: qty 30

## 2016-04-04 MED ORDER — COCONUT OIL OIL: 1.0000 "application " | TOPICAL_OIL | Status: DC | PRN

## 2016-04-04 MED ORDER — ONDANSETRON HCL 4 MG/2ML IJ SOLN
INTRAMUSCULAR | Status: DC | PRN
  Administered 2016-04-04: 4 mg via INTRAVENOUS

## 2016-04-04 MED ORDER — DIPHENHYDRAMINE HCL 25 MG PO CAPS: 25.0000 mg | ORAL_CAPSULE | Freq: Four times a day (QID) | ORAL | Status: DC | PRN

## 2016-04-04 MED ORDER — PHENYLEPHRINE 8 MG IN D5W 100 ML (0.08MG/ML) PREMIX OPTIME
INJECTION | INTRAVENOUS | Status: DC | PRN
  Administered 2016-04-04: 60 ug/min via INTRAVENOUS

## 2016-04-04 MED ORDER — FERROUS SULFATE 325 (65 FE) MG PO TABS
325.0000 mg | ORAL_TABLET | Freq: Two times a day (BID) | ORAL | Status: DC
  Administered 2016-04-04 – 2016-04-05 (×3): 325 mg via ORAL
  Filled 2016-04-04 (×3): qty 1

## 2016-04-04 MED ORDER — TETANUS-DIPHTH-ACELL PERTUSSIS 5-2.5-18.5 LF-MCG/0.5 IM SUSP: 0.5000 mL | Freq: Once | INTRAMUSCULAR | Status: DC

## 2016-04-04 MED ORDER — SCOPOLAMINE 1 MG/3DAYS TD PT72
MEDICATED_PATCH | TRANSDERMAL | Status: AC
  Administered 2016-04-04: 1.5 mg via TRANSDERMAL
  Filled 2016-04-04: qty 1

## 2016-04-04 MED ORDER — BUPIVACAINE HCL (PF) 0.5 % IJ SOLN
INTRAMUSCULAR | Status: AC
  Filled 2016-04-04: qty 30

## 2016-04-04 MED ORDER — MEPERIDINE HCL 25 MG/ML IJ SOLN: 6.2500 mg | INTRAMUSCULAR | Status: DC | PRN

## 2016-04-04 MED ORDER — PROMETHAZINE HCL 25 MG/ML IJ SOLN: 6.2500 mg | INTRAMUSCULAR | Status: DC | PRN

## 2016-04-04 MED ORDER — SENNOSIDES-DOCUSATE SODIUM 8.6-50 MG PO TABS
2.0000 | ORAL_TABLET | ORAL | Status: DC
  Administered 2016-04-04 – 2016-04-05 (×2): 2 via ORAL
  Filled 2016-04-04 (×2): qty 2

## 2016-04-04 MED ORDER — OXYCODONE-ACETAMINOPHEN 5-325 MG PO TABS
1.0000 | ORAL_TABLET | ORAL | Status: DC | PRN
  Administered 2016-04-05 – 2016-04-06 (×5): 1 via ORAL
  Filled 2016-04-04 (×5): qty 1

## 2016-04-04 MED ORDER — SCOPOLAMINE 1 MG/3DAYS TD PT72
1.0000 | MEDICATED_PATCH | Freq: Once | TRANSDERMAL | Status: DC
Start: 2016-04-04 — End: 2016-04-04
  Administered 2016-04-04: 1.5 mg via TRANSDERMAL

## 2016-04-04 MED ORDER — LACTATED RINGERS IV SOLN
Freq: Once | INTRAVENOUS | Status: AC
  Administered 2016-04-04 (×2): via INTRAVENOUS

## 2016-04-04 MED ORDER — DIPHENHYDRAMINE HCL 50 MG/ML IJ SOLN: 12.5000 mg | INTRAMUSCULAR | Status: DC | PRN

## 2016-04-04 MED ORDER — SIMETHICONE 80 MG PO CHEW
80.0000 mg | CHEWABLE_TABLET | ORAL | Status: DC
  Administered 2016-04-04 – 2016-04-05 (×3): 80 mg via ORAL
  Filled 2016-04-04 (×2): qty 1

## 2016-04-04 MED ORDER — DIPHENHYDRAMINE HCL 25 MG PO CAPS: 25.0000 mg | ORAL_CAPSULE | ORAL | Status: DC | PRN

## 2016-04-04 MED ORDER — FENTANYL CITRATE (PF) 100 MCG/2ML IJ SOLN
INTRAMUSCULAR | Status: DC | PRN
  Administered 2016-04-04: 90 ug via INTRAVENOUS
  Administered 2016-04-04: 10 ug via INTRATHECAL

## 2016-04-04 MED ORDER — OXYCODONE-ACETAMINOPHEN 5-325 MG PO TABS: 2.0000 | ORAL_TABLET | ORAL | Status: DC | PRN

## 2016-04-04 MED ORDER — OXYTOCIN 10 UNIT/ML IJ SOLN
INTRAMUSCULAR | Status: AC
  Filled 2016-04-04: qty 4

## 2016-04-04 MED ORDER — MORPHINE SULFATE (PF) 0.5 MG/ML IJ SOLN
INTRAMUSCULAR | Status: DC | PRN
  Administered 2016-04-04: .2 mg via INTRATHECAL

## 2016-04-04 MED ORDER — WITCH HAZEL-GLYCERIN EX PADS: 1.0000 "application " | MEDICATED_PAD | CUTANEOUS | Status: DC | PRN

## 2016-04-04 MED ORDER — ONDANSETRON HCL 4 MG/2ML IJ SOLN
INTRAMUSCULAR | Status: AC
  Filled 2016-04-04: qty 2

## 2016-04-04 MED ORDER — OXYTOCIN 40 UNITS IN LACTATED RINGERS INFUSION - SIMPLE MED: 2.5000 [IU]/h | INTRAVENOUS | Status: DC

## 2016-04-04 MED ORDER — LACTATED RINGERS IV SOLN
INTRAVENOUS | Status: DC
  Administered 2016-04-04: 17:00:00 via INTRAVENOUS

## 2016-04-04 MED ORDER — LACTATED RINGERS IV SOLN
INTRAVENOUS | Status: DC | PRN
  Administered 2016-04-04: 40 [IU] via INTRAVENOUS

## 2016-04-04 MED ORDER — SODIUM CHLORIDE 0.9% FLUSH: 3.0000 mL | INTRAVENOUS | Status: DC | PRN

## 2016-04-04 MED ORDER — FENTANYL CITRATE (PF) 100 MCG/2ML IJ SOLN
INTRAMUSCULAR | Status: AC
  Filled 2016-04-04: qty 2

## 2016-04-04 MED ORDER — DIBUCAINE 1 % RE OINT: 1.0000 "application " | TOPICAL_OINTMENT | RECTAL | Status: DC | PRN

## 2016-04-04 MED ORDER — ACETAMINOPHEN 500 MG PO TABS
1000.0000 mg | ORAL_TABLET | Freq: Four times a day (QID) | ORAL | Status: AC
  Administered 2016-04-04 – 2016-04-05 (×4): 1000 mg via ORAL
  Filled 2016-04-04 (×3): qty 2

## 2016-04-04 MED ORDER — MEASLES, MUMPS & RUBELLA VAC ~~LOC~~ INJ
0.5000 mL | INJECTION | Freq: Once | SUBCUTANEOUS | Status: DC
  Filled 2016-04-04: qty 0.5

## 2016-04-04 MED ORDER — PHENYLEPHRINE 8 MG IN D5W 100 ML (0.08MG/ML) PREMIX OPTIME
INJECTION | INTRAVENOUS | Status: AC
  Filled 2016-04-04: qty 100

## 2016-04-04 SURGICAL SUPPLY — 37 items
CHLORAPREP W/TINT 26ML (MISCELLANEOUS) ×3 IMPLANT
CLAMP CORD UMBIL (MISCELLANEOUS) IMPLANT
CLIP FILSHIE TUBAL LIGA STRL (Clip) ×3 IMPLANT
CLOSURE WOUND 1/2 X4 (GAUZE/BANDAGES/DRESSINGS) ×1
CLOTH BEACON ORANGE TIMEOUT ST (SAFETY) ×3 IMPLANT
DRSG OPSITE POSTOP 4X10 (GAUZE/BANDAGES/DRESSINGS) ×3 IMPLANT
ELECT REM PT RETURN 9FT ADLT (ELECTROSURGICAL) ×3
ELECTRODE REM PT RTRN 9FT ADLT (ELECTROSURGICAL) ×1 IMPLANT
EXTRACTOR VACUUM M CUP 4 TUBE (SUCTIONS) IMPLANT
EXTRACTOR VACUUM M CUP 4' TUBE (SUCTIONS)
GLOVE BIOGEL PI IND STRL 7.0 (GLOVE) ×3 IMPLANT
GLOVE BIOGEL PI INDICATOR 7.0 (GLOVE) ×6
GLOVE ECLIPSE 7.0 STRL STRAW (GLOVE) ×3 IMPLANT
GOWN STRL REUS W/TWL LRG LVL3 (GOWN DISPOSABLE) ×6 IMPLANT
HEMOSTAT SURGICEL 2X14 (HEMOSTASIS) ×3 IMPLANT
KIT ABG SYR 3ML LUER SLIP (SYRINGE) IMPLANT
NEEDLE HYPO 22GX1.5 SAFETY (NEEDLE) ×3 IMPLANT
NEEDLE HYPO 25X5/8 SAFETYGLIDE (NEEDLE) ×3 IMPLANT
NS IRRIG 1000ML POUR BTL (IV SOLUTION) ×3 IMPLANT
PACK C SECTION WH (CUSTOM PROCEDURE TRAY) ×3 IMPLANT
PAD ABD 7.5X8 STRL (GAUZE/BANDAGES/DRESSINGS) ×3 IMPLANT
PAD ABD 8X10 STRL (GAUZE/BANDAGES/DRESSINGS) ×3 IMPLANT
PAD OB MATERNITY 4.3X12.25 (PERSONAL CARE ITEMS) ×3 IMPLANT
PENCIL SMOKE EVAC W/HOLSTER (ELECTROSURGICAL) ×3 IMPLANT
RTRCTR C-SECT PINK 25CM LRG (MISCELLANEOUS) IMPLANT
SPONGE GAUZE 4X4 FOR O.R. (GAUZE/BANDAGES/DRESSINGS) ×3 IMPLANT
STRIP CLOSURE SKIN 1/2X4 (GAUZE/BANDAGES/DRESSINGS) ×2 IMPLANT
SUT PDS AB 0 CTX 36 PDP370T (SUTURE) ×3 IMPLANT
SUT PLAIN 2 0 XLH (SUTURE) IMPLANT
SUT VIC AB 0 CTX 36 (SUTURE) ×6
SUT VIC AB 0 CTX36XBRD ANBCTRL (SUTURE) ×3 IMPLANT
SUT VIC AB 2-0 CT1 27 (SUTURE) ×2
SUT VIC AB 2-0 CT1 TAPERPNT 27 (SUTURE) ×1 IMPLANT
SUT VIC AB 4-0 KS 27 (SUTURE) ×3 IMPLANT
SYR CONTROL 10ML LL (SYRINGE) ×3 IMPLANT
TOWEL OR 17X24 6PK STRL BLUE (TOWEL DISPOSABLE) ×3 IMPLANT
TRAY FOLEY CATH SILVER 14FR (SET/KITS/TRAYS/PACK) ×3 IMPLANT

## 2016-04-04 NOTE — Anesthesia Postprocedure Evaluation (Signed)
Anesthesia Post Note  Patient: Antionette CharJuliana Swier  Procedure(s) Performed: Procedure(s) (LRB): CESAREAN SECTION WITH BILATERAL TUBAL LIGATION (Bilateral)  Patient location during evaluation: PACU Anesthesia Type: Spinal Level of consciousness: oriented and awake and alert Pain management: pain level controlled Vital Signs Assessment: post-procedure vital signs reviewed and stable Respiratory status: spontaneous breathing, respiratory function stable and patient connected to nasal cannula oxygen Cardiovascular status: blood pressure returned to baseline and stable Postop Assessment: no headache, no backache, spinal receding, no signs of nausea or vomiting and patient able to bend at knees Anesthetic complications: no     Last Vitals:  Vitals:   04/04/16 1319 04/04/16 1421  BP: 115/79 108/70  Pulse: 70 63  Resp: 20 20  Temp: 36.8 C 36.5 C    Last Pain:  Vitals:   04/04/16 1421  TempSrc: Oral  PainSc: 3    Pain Goal: Patients Stated Pain Goal: 3 (04/04/16 0818)               Cecile HearingStephen Edward Rashi Giuliani

## 2016-04-04 NOTE — Lactation Note (Signed)
This note was copied from a baby's chart. Lactation Consultation Note  Patient Name: Girl Antionette CharJuliana Maudlin ZOXWR'UToday's Date: 04/04/2016 Reason for consult: Initial assessment Baby at 6 hr of life. Even though mom was able to manually express and spoon feed colostrum she thinks she is not making enough. She stated with her oldest child her milk "gushed" the 1st day and her her breast were hard. She does want breast and formula, discussed risk of formula feeding. She is reporting bilateral sore nipples, no skin break down noted. Discussed baby behavior, feeding frequency, baby belly size, voids, wt loss, breast changes, and nipple care. Given lactation handouts. Aware of OP services and support group.    Maternal Data Has patient been taught Hand Expression?: Yes Does the patient have breastfeeding experience prior to this delivery?: Yes  Feeding Feeding Type: Breast Fed Length of feed: 5 min  LATCH Score/Interventions Latch: Grasps breast easily, tongue down, lips flanged, rhythmical sucking. Intervention(s): Assist with latch;Adjust position;Breast compression  Audible Swallowing: A few with stimulation Intervention(s): Skin to skin;Hand expression  Type of Nipple: Everted at rest and after stimulation  Comfort (Breast/Nipple): Soft / non-tender     Hold (Positioning): Assistance needed to correctly position infant at breast and maintain latch. Intervention(s): Breastfeeding basics reviewed;Skin to skin;Support Pillows  LATCH Score: 8  Lactation Tools Discussed/Used WIC Program: Yes   Consult Status Consult Status: Follow-up Date: 04/05/16 Follow-up type: In-patient    Rulon Eisenmengerlizabeth E Aryaan Persichetti 04/04/2016, 4:32 PM

## 2016-04-04 NOTE — Anesthesia Procedure Notes (Signed)
Spinal  Patient location during procedure: OR Staffing Anesthesiologist: Cecile HearingURK, Yasin Ducat EDWARD Performed: anesthesiologist  Preanesthetic Checklist Completed: patient identified, surgical consent, pre-op evaluation, timeout performed, IV checked, risks and benefits discussed and monitors and equipment checked Spinal Block Patient position: sitting Prep: site prepped and draped and DuraPrep Patient monitoring: continuous pulse ox and blood pressure Approach: midline Location: L3-4 Needle Needle type: Pencan  Needle gauge: 24 G Catheter type: closed end Catheter size: 19 g Catheter at skin depth: 10 cm Assessment Sensory level: T4 Additional Notes COMBINED SPINAL EPIDURAL. Touhy advanced and LOR to air at 5cm, spinal needle advanced through touhy, +CSF return, spinal dose given, epidural catheter threaded through touhy and taped at 10cm at the skin. Patient tolerated well.  Functioning IV was confirmed and monitors were applied. Sterile prep and drape, including hand hygiene, mask and sterile gloves were used. The patient was positioned and the spine was prepped. The skin was anesthetized with lidocaine.  Free flow of clear CSF was obtained prior to injecting local anesthetic into the CSF.  The spinal needle aspirated freely following injection.  The needle was carefully withdrawn.  The patient tolerated the procedure well. Consent was obtained prior to procedure with all questions answered and concerns addressed. Risks including but not limited to bleeding, infection, nerve damage, paralysis, failed block, inadequate analgesia, allergic reaction, high spinal, itching and headache were discussed and the patient wished to proceed.   Arrie AranStephen Domenick Quebedeaux, MD

## 2016-04-04 NOTE — Anesthesia Postprocedure Evaluation (Signed)
Anesthesia Post Note  Patient: Antionette CharJuliana Tamayo  Procedure(s) Performed: Procedure(s) (LRB): CESAREAN SECTION WITH BILATERAL TUBAL LIGATION (Bilateral)  Patient location during evaluation: Mother Baby Anesthesia Type: Combined Spinal/Epidural Level of consciousness: awake and alert and oriented Pain management: satisfactory to patient Vital Signs Assessment: post-procedure vital signs reviewed and stable Respiratory status: respiratory function stable and spontaneous breathing Cardiovascular status: blood pressure returned to baseline Postop Assessment: no headache, no backache, spinal receding, patient able to bend at knees, no signs of nausea or vomiting and adequate PO intake Anesthetic complications: no     Last Vitals:  Vitals:   04/04/16 1533 04/04/16 1636  BP: 106/67 110/65  Pulse: 69 62  Resp:  20  Temp: 36.8 C 36.7 C    Last Pain:  Vitals:   04/04/16 1636  TempSrc: Oral  PainSc: 3    Pain Goal: Patients Stated Pain Goal: 3 (04/04/16 0818)               Karleen DolphinFUSSELL,Mandi Mattioli

## 2016-04-04 NOTE — Transfer of Care (Signed)
Immediate Anesthesia Transfer of Care Note  Patient: Stefanie Hernandez  Procedure(s) Performed: Procedure(s): CESAREAN SECTION WITH BILATERAL TUBAL LIGATION (Bilateral)  Patient Location: PACU  Anesthesia Type:Spinal  Level of Consciousness: awake, alert  and oriented  Airway & Oxygen Therapy: Patient Spontanous Breathing  Post-op Assessment: Report given to RN and Post -op Vital signs reviewed and stable  Post vital signs: Reviewed and stable  Last Vitals:  Vitals:   04/04/16 0818  BP: 101/73  Pulse: 95  Resp: 20  Temp: 36.7 C    Last Pain:  Vitals:   04/04/16 0818  TempSrc: Oral  PainSc: 4       Patients Stated Pain Goal: 3 (04/04/16 0818)  Complications: No apparent anesthesia complications

## 2016-04-04 NOTE — Op Note (Signed)
Stefanie Hernandez PROCEDURE DATE: 04/04/2016  PREOPERATIVE DIAGNOSES: Intrauterine pregnancy at [redacted]w[redacted]d weeks gestation;previous cesarean section x 3; undesired fertility  POSTOPERATIVE DIAGNOSES: The same  PROCEDURE: Repeat Low Transverse Cesarean Section, Bilateral Tubal Sterilization using Filshie clips  SURGEON:  Dr. Jaynie Collins  ASSISTANT:  Dr. Ernestina Penna  STUDENT:  Jacklynn Lewis, MS III  ANESTHESIOLOGIST: Dr. Arrie Aran  INDICATIONS: Stefanie Hernandez is a 30 y.o. [id number] at [redacted]w[redacted]d here for repeat cesarean section and bilateral tubal sterilization secondary to the indications listed under preoperative diagnoses; please see preoperative note for further details.  The risks of surgery were discussed with the patient including but were not limited to: bleeding which may require transfusion or reoperation; infection which may require antibiotics; injury to bowel, bladder, ureters or other surrounding organs; injury to the fetus; need for additional procedures including hysterectomy in the event of a life-threatening hemorrhage; placental abnormalities wth subsequent pregnancies, incisional problems, thromboembolic phenomenon and other postoperative/anesthesia complications.  Patient also desires permanent sterilization.  Other reversible forms of contraception were discussed with patient; she declines all other modalities. Risks of procedure discussed with patient including but not limited to: risk of regret, permanence of method, bleeding, infection, injury to surrounding organs and need for additional procedures.  Failure risk of 1-2% with increased risk of ectopic gestation if pregnancy occurs was also discussed with patient.  The patient concurred with the proposed plan, giving informed written consent for the procedures.    FINDINGS:  Viable female infant in cephalic presentation.  Apgars 4 and 9.  Clear amniotic fluid.  Intact placenta, three vessel cord.  Normal uterus, fallopian tubes and  ovaries bilaterally. Fallopian tubes sterilized with Filshie clips bilaterally.  Mild intraperitoneal adhesive disease, but patient exhibited some diffuse punctate bleeding on all surfaces especially rectus muscles after procedure controlled with electrocautery and pressure.  ANESTHESIA: Combined Spinal-Epidural INTRAVENOUS FLUIDS: 3000 ml ESTIMATED BLOOD LOSS: 800 ml URINE OUTPUT:  100 ml SPECIMENS: Placenta sent to L&D COMPLICATIONS: None immediate  PROCEDURE IN DETAIL:  The patient preoperatively received intravenous antibiotics and had sequential compression devices applied to her lower extremities.   She was then taken to the operating room where anesthesia was administered and was found to be adequate. She was then placed in a dorsal supine position with a leftward tilt, and prepped and draped in a sterile manner.  A foley catheter was placed into her bladder and attached to constant gravity.  After an adequate timeout was performed, a Pfannenstiel skin incision was made with scalpel over her preexisting scar and carried through to the underlying layer of fascia. The fascia was incised in the midline, and this incision was extended bilaterally using the Mayo scissors.  Kocher clamps were applied to the superior aspect of the fascial incision and the underlying rectus muscles were dissected off bluntly. A similar process was carried out on the inferior aspect of the fascial incision. The rectus muscles were separated in the midline bluntly and the peritoneum was entered bluntly. Attention was turned to the lower uterine segment where a low transverse hysterotomy was made with a scalpel and extended bilaterally bluntly.  The infant was successfully delivered, the cord was clamped and cut after one minute, and the infant was handed over to awaiting neonatology team. Uterine massage was then administered, and the placenta delivered intact with a three-vessel cord. The uterus was then cleared of clot and  debris.  The hysterotomy was closed with 0 Vicryl in a running locked fashion, and an imbricating layer  was also placed with 0 Vicryl.  Figure-of-eight 0 Vicryl serosal stitches were placed to help with hemostasis.  Attention was then turned to the fallopian tubes, and Filshie clips were placed about 3 cm from the cornua, with care given to incorporate the underlying mesosalpinx on both sides, allowing for bilateral tubal sterilization. The pelvis was cleared of all clot and debris. Hemostasis was confirmed on all surfaces.  The peritoneum and the muscles were reapproximated using 2-0 Vicryl running stitch. The fascia was then closed using 0 PDS in a running fashion.  The subcutaneous layer was irrigated and 30 ml of 0.5% Marcaine was injected subcutaneously around the incision.  The skin was closed with a 4-0 Vicryl subcuticular stitch. The patient tolerated the procedure well. Sponge, lap, instrument and needle counts were correct x 3.  She was taken to the recovery room in stable condition.    Jaynie CollinsUGONNA  Stanley Helmuth, MD, FACOG Attending Obstetrician & Gynecologist Faculty Practice, [facility] [place]

## 2016-04-04 NOTE — Anesthesia Preprocedure Evaluation (Addendum)
Anesthesia Evaluation  Patient identified by MRN, date of birth, ID band Patient awake    Reviewed: Allergy & Precautions, NPO status , Patient's Chart, lab work & pertinent test results  Airway Mallampati: II  TM Distance: >3 FB Neck ROM: Full    Dental  (+) Teeth Intact, Dental Advisory Given   Pulmonary neg pulmonary ROS,    Pulmonary exam normal breath sounds clear to auscultation       Cardiovascular negative cardio ROS Normal cardiovascular exam Rhythm:Regular Rate:Normal     Neuro/Psych negative neurological ROS     GI/Hepatic negative GI ROS, Neg liver ROS,   Endo/Other  negative endocrine ROS  Renal/GU negative Renal ROS     Musculoskeletal negative musculoskeletal ROS (+)   Abdominal   Peds  Hematology negative hematology ROS (+)   Anesthesia Other Findings Day of surgery medications reviewed with the patient.  Reproductive/Obstetrics (+) Pregnancy [id number] with IUP at 4716w0d presenting for scheduled repeat cesarean section and bilateral tubal sterilization  H/o C-section x3                            Anesthesia Physical Anesthesia Plan  ASA: II  Anesthesia Plan: Combined Spinal and Epidural   Post-op Pain Management:    Induction:   Airway Management Planned:   Additional Equipment:   Intra-op Plan:   Post-operative Plan:   Informed Consent: I have reviewed the patients History and Physical, chart, labs and discussed the procedure including the risks, benefits and alternatives for the proposed anesthesia with the patient or authorized representative who has indicated his/her understanding and acceptance.   Dental advisory given  Plan Discussed with: CRNA, Anesthesiologist and Surgeon  Anesthesia Plan Comments: (Discussed risks and benefits of and differences between spinal and general. Discussed risks of spinal including headache, backache, failure, bleeding,  infection, and nerve damage. Patient consents to spinal. Questions answered. Coagulation studies and platelet count acceptable.  Previous C-section x3. Will place CSE.)       Anesthesia Quick Evaluation

## 2016-04-04 NOTE — Addendum Note (Signed)
Addendum  created 04/04/16 1737 by Graciela HusbandsWynn O Lenford Beddow, CRNA   Delete clinical note, Sign clinical note

## 2016-04-04 NOTE — Anesthesia Postprocedure Evaluation (Deleted)
Anesthesia Post Note  Patient: Stefanie CharJuliana Hernandez  Procedure(s) Performed: Procedure(s) (LRB): CESAREAN SECTION WITH BILATERAL TUBAL LIGATION (Bilateral)  Patient location during evaluation: Mother Baby Anesthesia Type: Epidural Level of consciousness: awake and alert Pain management: satisfactory to patient Vital Signs Assessment: post-procedure vital signs reviewed and stable Respiratory status: respiratory function stable Cardiovascular status: stable Postop Assessment: no headache, no backache, epidural receding, patient able to bend at knees, no signs of nausea or vomiting and adequate PO intake Anesthetic complications: no     Last Vitals:  Vitals:   04/04/16 1533 04/04/16 1636  BP: 106/67 110/65  Pulse: 69 62  Resp:  20  Temp: 36.8 C 36.7 C    Last Pain:  Vitals:   04/04/16 1636  TempSrc: Oral  PainSc: 3    Pain Goal: Patients Stated Pain Goal: 3 (04/04/16 0818)               Karleen DolphinFUSSELL,Charlie Hernandez

## 2016-04-04 NOTE — H&P (Signed)
Obstetric Preoperative History and Physical  Stefanie Hernandez is a 30 y.o. [phone] with IUP at [redacted]w[redacted]d presenting for scheduled repeat cesarean section and bilateral tubal sterilization.  No acute concerns.   Prenatal Course Source of Care: [facility] Pregnancy complications or risks: Previous cesarean section x 3 She plans to breastfeed She desires bilateral tubal ligation for postpartum contraception.   Prenatal labs and studies: ABO, Rh: --/--/O POS (08/09 1148) Antibody: NEG (08/09 1148) Rubella: Immune (03/16 0000) RPR: Nonreactive (03/16 0000)  HBsAg: Negative (03/16 0000)  HIV: Non-reactive (03/16 0000)  AVW:UJWJXBJY (07/25 0000) 1 hr Glucola  normal Genetic screening normal Anatomy US normal   Past Medical History:  Diagnosis Date  . No pertinent past medical history     Past Surgical History:  Procedure Laterality Date  . CESAREAN SECTION    . CESAREAN SECTION  08/05/2012   Procedure: CESAREAN SECTION;  Surgeon: Kathreen Cosier, MD;  Location: [facility];  Service: Obstetrics;  Laterality: N/A;    OB History  Gravida Para Term Preterm AB Living  SAB TAB Ectopic Multiple Live Births          1    # Outcome Date GA Lbr Len/2nd Weight Sex Delivery Anes PTL Lv  5 Current           4 Term 08/05/12 [redacted]w[redacted]d   F CS-LTranv Spinal  LIV  3 AB           2 Term           1 Term               Social History   Social History  . Marital status: Single    Spouse name: N/A  . Number of children: N/A  . Years of education: N/A   Social History Main Topics  . Smoking status: Never Smoker  . Smokeless tobacco: Never Used  . Alcohol use No  . Drug use: No  . Sexual activity: Yes    Birth control/ protection: None   Other Topics Concern  . None   Social History Narrative  . None    History reviewed. No pertinent family history.  Prescriptions Prior to Admission  Medication Sig Dispense Refill Last Dose  . acetaminophen (TYLENOL) 325 MG tablet Take 650 mg  by mouth every 6 (six) hours as needed for pain (For headaches.).   Past Month at Unknown time  . Prenatal Vit-Fe Fumarate-FA (PRENATAL MULTIVITAMIN) TABS Take 1 tablet by mouth daily at 12 noon.   04/03/2016 at Unknown time    No Known Allergies  Review of Systems: Negative except for what is mentioned in HPI.  Physical Exam: BP 101/73   Pulse 95   Temp 98 F (36.7 C) (Oral)   Resp 20   LMP 07/13/2015   SpO2 100%  FHR by Doppler: 142 bpm CONSTITUTIONAL: Well-developed, well-nourished female in no acute distress.  HENT:  Normocephalic, atraumatic, External right and left ear normal. Oropharynx is clear and moist EYES: Conjunctivae and EOM are normal. Pupils are equal, round, and reactive to light. No scleral icterus.  NECK: Normal range of motion, supple, no masses SKIN: Skin is warm and dry. No rash noted. Not diaphoretic. No erythema. No pallor. NEUROLGIC: Alert and oriented to person, place, and time. Normal reflexes, muscle tone coordination. No cranial nerve deficit noted. PSYCHIATRIC: Normal mood and affect. Normal behavior. Normal judgment and thought content. CARDIOVASCULAR: Normal heart rate noted, regular rhythm  RESPIRATORY: Effort and breath sounds normal, no problems with respiration noted ABDOMEN: Soft, nontender, nondistended, gravid. Well-healed Pfannenstiel incisions. PELVIC: Deferred MUSCULOSKELETAL: Normal range of motion. No edema and no tenderness. 2+ distal pulses.   Pertinent Labs/Studies:   Results for orders placed or performed during the hospital encounter of 04/04/16 (from the past 72 hour(s))  CBC     Status: Abnormal   Collection Time: 04/03/16 11:48 AM  Result Value Ref Range   WBC 6.8 4.0 - 10.5 K/uL   RBC 4.37 3.87 - 5.11 MIL/uL   Hemoglobin 11.0 (L) 12.0 - 15.0 g/dL   HCT 16.130.6 (L) 09.636.0 - 04.546.0 %   MCV 70.0 (L) 78.0 - 100.0 fL   MCH 25.2 (L) 26.0 - 34.0 pg   MCHC 35.9 30.0 - 36.0 g/dL   RDW 40.917.1 (H) 81.111.5 - 91.415.5 %   Platelets 146 (L) 150 - 400  K/uL    Assessment and Plan :Stefanie Hernandez is a 30 y.o. [id number] at 6576w0d being admitted for repeat cesarean section and bilateral tubal sterilization. The risks of cesarean section were discussed with the patient including but were not limited to: bleeding which may require transfusion or reoperation; infection which may require antibiotics; injury to bowel, bladder, ureters or other surrounding organs; injury to the fetus; need for additional procedures including hysterectomy in the event of a life-threatening hemorrhage; placental abnormalities wth subsequent pregnancies, incisional problems, thromboembolic phenomenon and other postoperative/anesthesia complications.  Patient also desires permanent sterilization.  Other reversible forms of contraception were discussed with patient; she declines all other modalities. Risks of procedure discussed with patient including but not limited to: risk of regret, permanence of method, bleeding, infection, injury to surrounding organs and need for additional procedures.  Failure risk of 1-2% with increased risk of ectopic gestation if pregnancy occurs was also discussed with patient.  The patient concurred with the proposed plan, giving informed written consent for the procedures.  Patient has been NPO since last night she will remain NPO for procedure. Anesthesia and OR aware. Preoperative prophylactic antibiotics and SCDs ordered on call to the OR. To OR when ready.    Jaynie CollinsUGONNA Kamiya Acord, MD, FACOG  Attending Obstetrician & Gynecologist  Faculty Practice, [facility]

## 2016-04-04 NOTE — Progress Notes (Signed)
Epidural catheter removed tip intact.

## 2016-04-05 LAB — CBC
HCT: 25.9 % — ABNORMAL LOW (ref 36.0–46.0)
HEMOGLOBIN: 9.7 g/dL — AB (ref 12.0–15.0)
MCH: 25.7 pg — ABNORMAL LOW (ref 26.0–34.0)
MCHC: 37.5 g/dL — AB (ref 30.0–36.0)
MCV: 68.5 fL — ABNORMAL LOW (ref 78.0–100.0)
Platelets: 148 10*3/uL — ABNORMAL LOW (ref 150–400)
RBC: 3.78 MIL/uL — ABNORMAL LOW (ref 3.87–5.11)
RDW: 16.9 % — AB (ref 11.5–15.5)
WBC: 12 10*3/uL — AB (ref 4.0–10.5)

## 2016-04-05 LAB — BIRTH TISSUE RECOVERY COLLECTION (PLACENTA DONATION)

## 2016-04-05 NOTE — Progress Notes (Signed)
Subjective: Postpartum Day #1: Cesarean Delivery/BTL Patient reports tolerating PO, + flatus and no problems voiding. Breastfeeding going well.   Objective: Vital signs in last 24 hours: Temp:  [97.4 F (36.3 C)-98.7 F (37.1 C)] 97.4 F (36.3 C) (08/11 1007) Pulse Rate:  [58-95] 61 (08/11 1007) Resp:  [20] 20 (08/11 1007) BP: (92-110)/(48-72) 101/63 (08/11 1007) SpO2:  [98 %-100 %] 100 % (08/11 1007)  Physical Exam:  General: alert, cooperative and no distress Lochia: appropriate Uterine Fundus: firm Incision: pressure dsg intact, clean and dry DVT Evaluation: No evidence of DVT seen on physical exam.   Recent Labs  04/04/16 1141 04/05/16 0549  HGB 10.3* 9.7*  HCT 28.3* 25.9*    Assessment/Plan: Status post Cesarean section. Doing well postoperatively.  Continue current care. Anticipate d/c 04/06/16  Stefanie Hernandez, Stefanie Hernandez CNM 04/05/2016, 3:27 PM

## 2016-04-06 MED ORDER — OXYCODONE-ACETAMINOPHEN 5-325 MG PO TABS: 1.0000 | ORAL_TABLET | ORAL | 0 refills | Status: DC | PRN

## 2016-04-06 MED ORDER — IBUPROFEN 600 MG PO TABS: 600.0000 mg | ORAL_TABLET | Freq: Four times a day (QID) | ORAL | 0 refills | Status: DC | PRN

## 2016-04-06 MED ORDER — FERROUS SULFATE 325 (65 FE) MG PO TABS: 325.0000 mg | ORAL_TABLET | Freq: Two times a day (BID) | ORAL | 3 refills | Status: DC

## 2016-04-06 NOTE — Discharge Summary (Signed)
OB Discharge Summary     Patient Name: Stefanie Hernandez DOB: [birth year] MRN: [id number]  Date of admission: 04/04/2016 Delivering MD: Jaynie CollinsANYANWU, UGONNA A   Date of discharge: 04/06/2016  Admitting diagnosis: PREVIOUS C SECTION DESIRES  Intrauterine pregnancy: 5133w0d     Secondary diagnosis:  Desires sterilization Additional problems: Prev C/S x 3     Discharge diagnosis: Term Pregnancy Delivered ; s/p BTL                                                                                            Post partum procedures:none  Augmentation: N/A  Complications: None  Hospital course:  Sceduled C/S   30 y.o. yo [id number] at 1833w0d was admitted to the hospital 04/04/2016 for scheduled cesarean section with the following indication:Elective Repeat.  Membrane Rupture Time/Date: 10:03 AM ,04/04/2016   Patient delivered a Viable infant.04/04/2016  Details of operation can be found in separate operative note.  Pateint had an uncomplicated postpartum course.  She is ambulating, tolerating a regular diet, passing flatus, and urinating well. Patient is discharged home in stable condition on  04/06/16          Physical exam Vitals:   04/05/16 0220 04/05/16 0625 04/05/16 1007 04/05/16 1830  BP: (!) 92/48 (!) 97/58 101/63 106/64  Pulse: (!) 58 (!) 58 61 79  Resp: 20 20 20 18   Temp: 98.4 F (36.9 C) 98.5 F (36.9 C) 97.4 F (36.3 C) 98.1 F (36.7 C)  TempSrc: Oral Oral Oral Oral  SpO2: 98% 100% 100%    General: alert and cooperative Lochia: appropriate Uterine Fundus: firm Incision: Healing well with no significant drainage DVT Evaluation: No evidence of DVT seen on physical exam. Labs: Lab Results  Component Value Date   WBC 12.0 (H) 04/05/2016   HGB 9.7 (L) 04/05/2016   HCT 25.9 (L) 04/05/2016   MCV 68.5 (L) 04/05/2016   PLT 148 (L) 04/05/2016   No flowsheet data found.  Discharge instruction: per After Visit Summary and "Baby and Me Booklet".  After visit meds:    Medication  List    STOP taking these medications   acetaminophen 325 MG tablet Commonly known as:  TYLENOL     TAKE these medications   ferrous sulfate 325 (65 FE) MG tablet Take 1 tablet (325 mg total) by mouth 2 (two) times daily with a meal.   ibuprofen 600 MG tablet Commonly known as:  ADVIL,MOTRIN Take 1 tablet (600 mg total) by mouth every 6 (six) hours as needed.   oxyCODONE-acetaminophen 5-325 MG tablet Commonly known as:  PERCOCET/ROXICET Take 1 tablet by mouth every 4 (four) hours as needed (pain scale 4-7).   prenatal multivitamin Tabs tablet Take 1 tablet by mouth daily at 12 noon.       Diet: routine diet  Activity: Advance as tolerated. Pelvic rest for 6 weeks.   Outpatient follow up:6 weeks Follow up Appt:No future appointments. Follow up Visit:No Follow-up on file.  Postpartum contraception: Tubal Ligation  Newborn Data: Live born female  Birth Weight: 7 lb 2.8 oz (3255 g) APGAR: 4, 9  Baby Feeding: Breast Disposition:home  with mother   04/06/2016 Cam Hai, CNM  9:50 AM

## 2016-04-06 NOTE — Discharge Instructions (Signed)

## 2016-04-06 NOTE — Lactation Note (Signed)
This note was copied from a baby's chart. Lactation Consultation Note  Mother latched baby in cradle hold w/ some nipple tenderness.  Helped w/ chin tug to widen narrow gape of mouth and assisted w/ a deeper latch. Pacifier use not recommended at this time. Discussed how pacifiers can change suck and narrow gape of mouth. Provided mother w/ manual pump and #27 flanges for larger nipples. Encouraged mother to breastfeed before offering formula. Reviewed engorgement care and monitoring voids/stools. Mom encouraged to feed baby 8-12 times/24 hours and with feeding cues.  Provided mother w/ comfort gels for sore nipples.  Provided pillow under baby to bring her to nipple height. Discussed applying ebm to nipples for healing.    Patient Name: Stefanie Antionette CharJuliana Herbst RUEAV'WToday's Date: 04/06/2016 Reason for consult: Follow-up assessment   Maternal Data    Feeding Feeding Type: Breast Fed Length of feed: 15 min  LATCH Score/Interventions Latch: Grasps breast easily, tongue down, lips flanged, rhythmical sucking. Intervention(s): Adjust position  Audible Swallowing: A few with stimulation  Type of Nipple: Everted at rest and after stimulation  Comfort (Breast/Nipple): Filling, red/small blisters or bruises, mild/mod discomfort  Problem noted: Mild/Moderate discomfort Interventions (Mild/moderate discomfort): Comfort gels  Hold (Positioning): No assistance needed to correctly position infant at breast.  LATCH Score: 8  Lactation Tools Discussed/Used     Consult Status Consult Status: Complete    Hardie PulleyBerkelhammer, Jansen Goodpasture Boschen 04/06/2016, 11:26 AM

## 2016-05-21 ENCOUNTER — Encounter: Payer: Self-pay | Admitting: *Deleted

## 2019-10-29 ENCOUNTER — Encounter: Payer: Self-pay | Admitting: Internal Medicine

## 2019-11-05 ENCOUNTER — Ambulatory Visit: Payer: Self-pay | Admitting: Internal Medicine

## 2020-09-22 ENCOUNTER — Encounter (HOSPITAL_COMMUNITY): Payer: Self-pay

## 2020-09-22 ENCOUNTER — Other Ambulatory Visit: Payer: Self-pay

## 2020-09-22 ENCOUNTER — Ambulatory Visit (HOSPITAL_COMMUNITY)
Admission: EM | Admit: 2020-09-22 | Discharge: 2020-09-22 | Disposition: A | Discharge status: Home / Self Care | Payer: Self-pay | Attending: Emergency Medicine | Admitting: Emergency Medicine

## 2020-09-22 DIAGNOSIS — R1031 Right lower quadrant pain: Secondary | ICD-10-CM

## 2020-09-22 DIAGNOSIS — Z3202 Encounter for pregnancy test, result negative: Secondary | ICD-10-CM

## 2020-09-22 LAB — POCT URINALYSIS DIPSTICK, ED / UC
Bilirubin Urine: NEGATIVE
Glucose, UA: NEGATIVE mg/dL
Hgb urine dipstick: NEGATIVE
Ketones, ur: NEGATIVE mg/dL
Leukocytes,Ua: NEGATIVE
Nitrite: NEGATIVE
Protein, ur: NEGATIVE mg/dL
Specific Gravity, Urine: <=1.005 (ref 1.005–1.030)
Urobilinogen, UA: 0.2 mg/dL (ref 0.0–1.0)
pH: 5.5 (ref 5.0–8.0)

## 2020-09-22 LAB — POC URINE PREG, ED: Preg Test, Ur: NEGATIVE

## 2020-09-22 NOTE — ED Provider Notes (Signed)
[facility]    CSN: [id number] Arrival date & time: 09/22/20  1019      History   Chief Complaint Chief Complaint  Patient presents with  . Abdominal Pain    HPI Stefanie Hernandez is a 35 y.o. female.   Patient presents with intermittent right lower quadrant abdominal pain x1 month.  She also reports upper abdominal pain for the past week.  She states her abdominal pain is worse if she holds her urine or has to stand for a long time.  She denies fever, chills, shortness of breath, vomiting, diarrhea, constipation, dysuria, hematuria, vaginal discharge, pelvic pain, or other symptoms.  Last BM yesterday.  Treatment attempted at home with Tylenol.  She denies pertinent medical history.  The history is provided by the patient and medical records.    Past Medical History:  Diagnosis Date  . No pertinent past medical history   . S/P cesarean section 04/04/2016    There are no problems to display for this patient.   Past Surgical History:  Procedure Laterality Date  . CESAREAN SECTION    . CESAREAN SECTION  08/05/2012   Procedure: CESAREAN SECTION;  Surgeon: Kathreen Cosier, MD;  Location: [facility];  Service: Obstetrics;  Laterality: N/A;  . CESAREAN SECTION WITH BILATERAL TUBAL LIGATION Bilateral 04/04/2016   Procedure: CESAREAN SECTION WITH BILATERAL TUBAL LIGATION;  Surgeon: Tereso Newcomer, MD;  Location: [facility];  Service: Obstetrics;  Laterality: Bilateral;    OB History    Gravida  5   Para  4   Term  4   Preterm      AB  1   Living  4     SAB      IAB      Ectopic      Multiple  0   Live Births  2            Home Medications    Prior to Admission medications   Not on File    Family History History reviewed. No pertinent family history.  Social History Social History   Tobacco Use  . Smoking status: Never Smoker  . Smokeless tobacco: Never Used  Substance Use Topics  . Alcohol use: No  . Drug use: No      Allergies   Patient has no known allergies.   Review of Systems Review of Systems  Constitutional: Negative for chills and fever.  HENT: Negative for ear pain and sore throat.   Eyes: Negative for pain and visual disturbance.  Respiratory: Negative for cough and shortness of breath.   Cardiovascular: Negative for chest pain and palpitations.  Gastrointestinal: Positive for abdominal pain. Negative for constipation, diarrhea, nausea and vomiting.  Genitourinary: Negative for dysuria, flank pain, hematuria, pelvic pain and vaginal discharge.  Musculoskeletal: Negative for arthralgias and back pain.  Skin: Negative for color change and rash.  Neurological: Negative for seizures and syncope.  All other systems reviewed and are negative.    Physical Exam Triage Vital Signs ED Triage Vitals  Enc Vitals Group     BP      Pulse      Resp      Temp      Temp src      SpO2      Weight      Height      Head Circumference      Peak Flow      Pain Score  Pain Loc      Pain Edu?      Excl. in GC?    No data found.  Updated Vital Signs BP 114/77   Pulse 83   Temp 99.1 F (37.3 C)   Resp 18   LMP 08/10/2020   SpO2 100%   Breastfeeding No   Visual Acuity Right Eye Distance:   Left Eye Distance:   Bilateral Distance:    Right Eye Near:   Left Eye Near:    Bilateral Near:     Physical Exam Vitals and nursing note reviewed.  Constitutional:      General: She is not in acute distress.    Appearance: She is well-developed and well-nourished. She is not ill-appearing.  HENT:     Head: Normocephalic and atraumatic.     Mouth/Throat:     Mouth: Mucous membranes are moist.  Eyes:     Conjunctiva/sclera: Conjunctivae normal.  Cardiovascular:     Rate and Rhythm: Normal rate and regular rhythm.     Heart sounds: Normal heart sounds.  Pulmonary:     Effort: Pulmonary effort is normal. No respiratory distress.     Breath sounds: Normal breath sounds.   Abdominal:     General: Bowel sounds are normal. There is no distension.     Palpations: Abdomen is soft.     Tenderness: There is no abdominal tenderness. There is no right CVA tenderness, left CVA tenderness, guarding or rebound.  Musculoskeletal:        General: No edema.     Cervical back: Neck supple.  Skin:    General: Skin is warm and dry.     Findings: No rash.  Neurological:     General: No focal deficit present.     Mental Status: She is alert and oriented to person, place, and time.     Gait: Gait normal.  Psychiatric:        Mood and Affect: Mood and affect and mood normal.        Behavior: Behavior normal.      UC Treatments / Results  Labs (all labs ordered are listed, but only abnormal results are displayed) Labs Reviewed  POCT URINALYSIS DIPSTICK, ED / UC  POC URINE PREG, ED    EKG   Radiology No results found.  Procedures Procedures (including critical care time)  Medications Ordered in UC Medications - No data to display  Initial Impression / Assessment and Plan / UC Course  I have reviewed the triage vital signs and the nursing notes.  Pertinent labs & imaging results that were available during my care of the patient were reviewed by me and considered in my medical decision making (see chart for details).   RLQ abdominal pain.  Patient is well-appearing, vital signs stable, exam unremarkable.  Urine negative, urine pregnancy negative.  Instructed her to go to the ED if she has acute abdominal pain or other concerning symptoms.  She agrees to plan of care.   Final Clinical Impressions(s) / UC Diagnoses   Final diagnoses:  Right lower quadrant abdominal pain     Discharge Instructions     Go to the emergency department if you have acute abdominal pain or other concerning symptoms.        ED Prescriptions    None     I have reviewed the PDMP during this encounter.   Mickie Bail, NP 09/22/20 1151

## 2020-09-22 NOTE — ED Triage Notes (Signed)
Pt in with c/o RLQ pain that has been going on over 1 month. States that when she holds her urine for a long time she feels a very sharp pain on her right side and is unable to stand  Also c/o upper abdominal pain for the past week  Pt has been taking tylenol with minimal relief

## 2020-09-22 NOTE — Discharge Instructions (Addendum)
Go to the emergency department if you have acute abdominal pain or other concerning symptoms.

## 2020-11-29 ENCOUNTER — Other Ambulatory Visit: Payer: Self-pay | Admitting: Physician Assistant

## 2020-11-29 DIAGNOSIS — R102 Pelvic and perineal pain: Secondary | ICD-10-CM

## 2020-11-30 ENCOUNTER — Ambulatory Visit
Admission: RE | Admit: 2020-11-30 | Discharge: 2020-11-30 | Disposition: A | Discharge status: Home / Self Care | Payer: No Typology Code available for payment source | Source: Ambulatory Visit | Attending: Physician Assistant | Admitting: Physician Assistant

## 2020-11-30 ENCOUNTER — Other Ambulatory Visit: Payer: Self-pay

## 2020-11-30 DIAGNOSIS — R102 Pelvic and perineal pain: Secondary | ICD-10-CM

## 2023-05-04 ENCOUNTER — Encounter (HOSPITAL_COMMUNITY): Payer: Self-pay

## 2023-05-04 ENCOUNTER — Emergency Department (HOSPITAL_COMMUNITY): Payer: No Typology Code available for payment source

## 2023-05-04 ENCOUNTER — Other Ambulatory Visit: Payer: Self-pay

## 2023-05-04 ENCOUNTER — Emergency Department (HOSPITAL_COMMUNITY)
Admission: EM | Admit: 2023-05-04 | Discharge: 2023-05-04 | Disposition: A | Discharge status: Home / Self Care | Payer: No Typology Code available for payment source | Attending: Emergency Medicine | Admitting: Emergency Medicine

## 2023-05-04 DIAGNOSIS — R112 Nausea with vomiting, unspecified: Secondary | ICD-10-CM | POA: Insufficient documentation

## 2023-05-04 DIAGNOSIS — R42 Dizziness and giddiness: Secondary | ICD-10-CM | POA: Insufficient documentation

## 2023-05-04 DIAGNOSIS — R0789 Other chest pain: Secondary | ICD-10-CM | POA: Insufficient documentation

## 2023-05-04 DIAGNOSIS — R519 Headache, unspecified: Secondary | ICD-10-CM | POA: Insufficient documentation

## 2023-05-04 LAB — COMPREHENSIVE METABOLIC PANEL
ALT: 14 U/L (ref 0–44)
AST: 22 U/L (ref 15–41)
Albumin: 4 g/dL (ref 3.5–5.0)
Alkaline Phosphatase: 53 U/L (ref 38–126)
Anion gap: 17 — ABNORMAL HIGH (ref 5–15)
BUN: 7 mg/dL (ref 6–20)
CO2: 14 mmol/L — ABNORMAL LOW (ref 22–32)
Calcium: 9.3 mg/dL (ref 8.9–10.3)
Chloride: 107 mmol/L (ref 98–111)
Creatinine, Ser: 0.78 mg/dL (ref 0.44–1.00)
GFR, Estimated: >60 mL/min (ref >60)
Glucose, Bld: 166 mg/dL — ABNORMAL HIGH (ref 70–99)
Potassium: 3.2 mmol/L — ABNORMAL LOW (ref 3.5–5.1)
Sodium: 138 mmol/L (ref 135–145)
Total Bilirubin: 0.9 mg/dL (ref 0.3–1.2)
Total Protein: 7.5 g/dL (ref 6.5–8.1)

## 2023-05-04 LAB — CBC WITH DIFFERENTIAL/PLATELET
Abs Immature Granulocytes: 0 10*3/uL (ref 0.00–0.07)
Basophils Absolute: 0.3 10*3/uL — ABNORMAL HIGH (ref 0.0–0.1)
Basophils Relative: 4 %
Eosinophils Absolute: 0.1 10*3/uL (ref 0.0–0.5)
Eosinophils Relative: 2 %
HCT: 30.1 % — ABNORMAL LOW (ref 36.0–46.0)
Hemoglobin: 10.2 g/dL — ABNORMAL LOW (ref 12.0–15.0)
Lymphocytes Relative: 56 %
Lymphs Abs: 4.1 10*3/uL — ABNORMAL HIGH (ref 0.7–4.0)
MCH: 22.5 pg — ABNORMAL LOW (ref 26.0–34.0)
MCHC: 33.9 g/dL (ref 30.0–36.0)
MCV: 66.3 fL — ABNORMAL LOW (ref 80.0–100.0)
Monocytes Absolute: 0.4 10*3/uL (ref 0.1–1.0)
Monocytes Relative: 5 %
Neutro Abs: 2.4 10*3/uL (ref 1.7–7.7)
Neutrophils Relative %: 33 %
Platelets: 246 10*3/uL (ref 150–400)
RBC: 4.54 MIL/uL (ref 3.87–5.11)
RDW: 17.2 % — ABNORMAL HIGH (ref 11.5–15.5)
WBC: 7.3 10*3/uL (ref 4.0–10.5)
nRBC: 0 % (ref 0.0–0.2)
nRBC: 0 /100{WBCs}

## 2023-05-04 LAB — TROPONIN I (HIGH SENSITIVITY)
Troponin I (High Sensitivity): <2 ng/L (ref <18)
Troponin I (High Sensitivity): <2 ng/L (ref <18)

## 2023-05-04 LAB — I-STAT VENOUS BLOOD GAS, ED
Acid-base deficit: 4 mmol/L — ABNORMAL HIGH (ref 0.0–2.0)
Bicarbonate: 20.4 mmol/L (ref 20.0–28.0)
Calcium, Ion: 1.22 mmol/L (ref 1.15–1.40)
HCT: 31 % — ABNORMAL LOW (ref 36.0–46.0)
Hemoglobin: 10.5 g/dL — ABNORMAL LOW (ref 12.0–15.0)
O2 Saturation: 56 %
Potassium: 3.7 mmol/L (ref 3.5–5.1)
Sodium: 143 mmol/L (ref 135–145)
TCO2: 21 mmol/L — ABNORMAL LOW (ref 22–32)
pCO2, Ven: 35.4 mmHg — ABNORMAL LOW (ref 44–60)
pH, Ven: 7.369 (ref 7.25–7.43)
pO2, Ven: 30 mmHg — CL (ref 32–45)

## 2023-05-04 LAB — I-STAT CG4 LACTIC ACID, ED: Lactic Acid, Venous: 1.8 mmol/L (ref 0.5–1.9)

## 2023-05-04 LAB — D-DIMER, QUANTITATIVE: D-Dimer, Quant: <0.27 ug{FEU}/mL (ref 0.00–0.50)

## 2023-05-04 LAB — HCG, SERUM, QUALITATIVE: Preg, Serum: NEGATIVE

## 2023-05-04 MED ORDER — MECLIZINE HCL 25 MG PO TABS
50.0000 mg | ORAL_TABLET | Freq: Once | ORAL | Status: AC
  Administered 2023-05-04: 50 mg via ORAL
  Filled 2023-05-04: qty 2

## 2023-05-04 MED ORDER — MECLIZINE HCL 25 MG PO TABS: 25.0000 mg | ORAL_TABLET | Freq: Three times a day (TID) | ORAL | 0 refills | Status: AC | PRN

## 2023-05-04 MED ORDER — LACTATED RINGERS IV BOLUS
1000.0000 mL | Freq: Once | INTRAVENOUS | Status: AC
  Administered 2023-05-04: 1000 mL via INTRAVENOUS

## 2023-05-04 NOTE — ED Provider Notes (Signed)
  Physical Exam  BP 117/70   Pulse 93   Temp (!) 97.5 F (36.4 C) (Oral)   Resp (!) 28   Ht 5\' 1"  (1.549 m)   Wt 64.9 kg   LMP 04/08/2023 (Approximate)   SpO2 100%   BMI 27.02 kg/m   Physical Exam Vitals and nursing note reviewed.  Constitutional:      Appearance: She is well-developed.  HENT:     Head: Normocephalic and atraumatic.  Cardiovascular:     Rate and Rhythm: Normal rate.  Pulmonary:     Effort: Pulmonary effort is normal.  Chest:     Chest wall: No tenderness.  Abdominal:     Palpations: Abdomen is soft.  Skin:    General: Skin is warm and dry.  Neurological:     Mental Status: She is alert.     Procedures  Procedures  ED Course / MDM   Clinical Course as of 05/04/23 1752  Wynelle Link May 04, 2023  1419 Patient to ED with ss/sxs as per HPI. Symptoms are recurrent, "I've had this many times before". No fever, recent illness. Feeling better now but still mildly dizzy. No nausea.  [SU]  1521 Head CT negative. Labs significant for mild hypokalemia. Meclizine given with improvement in dizziness. CO2 14 - significantly low, unexplained. No seizure activity, no excessive vomiting, no medications. Additional labs ordered, fluids. Will recheck when resulted. Patient updated on plan and agreeable to stay.  [SU]  1612 Plan: recheck CO2 after fluids, review VBG and lactic acid when resulted. Anticipate improvement after fluids. If no improvement, discuss with medicine to consider admission for unexplained acidosis. Patient care signed out to [facility], PA-C, pending labs and review as above.  [SU]  1737 TCO2(!): 21 [JS]    Clinical Course User Index [JS] Claude Manges, PA-C [SU] Elpidio Anis, PA-C   Medical Decision Making Amount and/or Complexity of Data Reviewed Labs:  Decision-making details documented in ED Course.   Patient care assumed from [place] [facility] PA at shift change, please see her note for a full HPI. Briefly, patient here with dizziness, room spinning,  prior episodes of the same. No weakness. Resolved after meclizine.  Plan was for recheck of VBG if improvement in CO2 will likely disposition home.  Lactic acid obtained as well which was negative.  I discussed these results with patient, she does feel better after bolus and after her meclizine.  We discussed strict follow-up with primary care physician.  She is agreeable to plan and treatment, stable for discharge.   Portions of this note were generated with Scientist, clinical (histocompatibility and immunogenetics). Dictation errors may occur despite best attempts at proofreading.        Claude Manges, PA-C 05/04/23 1811    Lonell Grandchild, MD 05/04/23 1900

## 2023-05-04 NOTE — ED Provider Notes (Signed)
[place] EMERGENCY DEPARTMENT AT [facility] Provider Note   CSN: [id number] Arrival date & time: 05/04/23  1228     History  Chief Complaint  Patient presents with   Dizziness   Chest Pain    Stefanie Hernandez is a 37 y.o. female.  Patient with history of anemia, no history of blood transfusions, presents with c/o sudden onset dizziness she describes as room spinning just prior to arrival in the ED. She reports having a headache earlier that is now resolved but dizziness continues. There is associated nausea and vomiting, and chest tightness. She felt like she was going to pass out but did not go completely out. She states she has had similar symptoms many times in the past, sometimes related to when her iron is low. No recent illness or fever. No SOB, cough, congestion. No diarrhea. She is not currently menstruating but "due any day".   The history is provided by the patient. No language interpreter was used.  Dizziness Associated symptoms: chest pain   Chest Pain Associated symptoms: dizziness        Home Medications Prior to Admission medications   Medication Sig Start Date End Date Taking? Authorizing Provider  meclizine (ANTIVERT) 25 MG tablet Take 1 tablet (25 mg total) by mouth 3 (three) times daily as needed for dizziness. 05/04/23  Yes Elpidio Anis, PA-C      Allergies    Patient has no known allergies.    Review of Systems   Review of Systems  Cardiovascular:  Positive for chest pain.  Neurological:  Positive for dizziness.    Physical Exam Updated Vital Signs BP 117/70   Pulse 93   Temp (!) 97.5 F (36.4 C) (Oral)   Resp (!) 28   Ht 5\' 1"  (1.549 m)   Wt 64.9 kg   LMP 04/08/2023 (Approximate)   SpO2 100%   BMI 27.02 kg/m  Physical Exam Vitals and nursing note reviewed.  Constitutional:      Appearance: She is well-developed.  Eyes:     Comments: No conjunctival pallor.  Cardiovascular:     Rate and Rhythm: Normal rate and regular  rhythm.     Heart sounds: No murmur heard. Pulmonary:     Effort: Pulmonary effort is normal.     Breath sounds: No wheezing, rhonchi or rales.  Abdominal:     General: There is no distension.     Palpations: Abdomen is soft.     Tenderness: There is no abdominal tenderness.  Musculoskeletal:        General: Normal range of motion.     Cervical back: Normal range of motion.  Skin:    General: Skin is warm and dry.  Neurological:     Mental Status: She is alert and oriented to person, place, and time.     GCS: GCS eye subscore is 4. GCS verbal subscore is 5. GCS motor subscore is 6.     Cranial Nerves: No cranial nerve deficit, dysarthria or facial asymmetry.     Sensory: Sensation is intact.     Motor: No weakness, tremor, abnormal muscle tone or pronator drift.     Coordination: Finger-Nose-Finger Test normal.     Deep Tendon Reflexes:     Reflex Scores:      Patellar reflexes are 2+ on the right side and 2+ on the left side.    ED Results / Procedures / Treatments   Labs (all labs ordered are listed, but only abnormal results  are displayed) Labs Reviewed  COMPREHENSIVE METABOLIC PANEL - Abnormal; Notable for the following components:      Result Value   Potassium 3.2 (*)    CO2 14 (*)    Glucose, Bld 166 (*)    Anion gap 17 (*)    All other components within normal limits  CBC WITH DIFFERENTIAL/PLATELET - Abnormal; Notable for the following components:   Hemoglobin 10.2 (*)    HCT 30.1 (*)    MCV 66.3 (*)    MCH 22.5 (*)    RDW 17.2 (*)    Lymphs Abs 4.1 (*)    Basophils Absolute 0.3 (*)    All other components within normal limits  HCG, SERUM, QUALITATIVE  D-DIMER, QUANTITATIVE  I-STAT VENOUS BLOOD GAS, ED  I-STAT CG4 LACTIC ACID, ED  TROPONIN I (HIGH SENSITIVITY)  TROPONIN I (HIGH SENSITIVITY)   Results for orders placed or performed during the hospital encounter of 05/04/23  hCG, serum, qualitative  Result Value Ref Range   Preg, Serum NEGATIVE NEGATIVE   D-dimer, quantitative  Result Value Ref Range   D-Dimer, Quant <0.27 0.00 - 0.50 ug/mL-FEU  Comprehensive metabolic panel  Result Value Ref Range   Sodium 138 135 - 145 mmol/L   Potassium 3.2 (L) 3.5 - 5.1 mmol/L   Chloride 107 98 - 111 mmol/L   CO2 14 (L) 22 - 32 mmol/L   Glucose, Bld 166 (H) 70 - 99 mg/dL   BUN 7 6 - 20 mg/dL   Creatinine, Ser 5.36 0.44 - 1.00 mg/dL   Calcium 9.3 8.9 - 64.4 mg/dL   Total Protein 7.5 6.5 - 8.1 g/dL   Albumin 4.0 3.5 - 5.0 g/dL   AST 22 15 - 41 U/L   ALT 14 0 - 44 U/L   Alkaline Phosphatase 53 38 - 126 U/L   Total Bilirubin 0.9 0.3 - 1.2 mg/dL   GFR, Estimated >03 >47 mL/min   Anion gap 17 (H) 5 - 15  CBC with Differential  Result Value Ref Range   WBC 7.3 4.0 - 10.5 K/uL   RBC 4.54 3.87 - 5.11 MIL/uL   Hemoglobin 10.2 (L) 12.0 - 15.0 g/dL   HCT 42.5 (L) 95.6 - 38.7 %   MCV 66.3 (L) 80.0 - 100.0 fL   MCH 22.5 (L) 26.0 - 34.0 pg   MCHC 33.9 30.0 - 36.0 g/dL   RDW 56.4 (H) 33.2 - 95.1 %   Platelets 246 150 - 400 K/uL   nRBC 0.0 0.0 - 0.2 %   Neutrophils Relative % 33 %   Neutro Abs 2.4 1.7 - 7.7 K/uL   Lymphocytes Relative 56 %   Lymphs Abs 4.1 (H) 0.7 - 4.0 K/uL   Monocytes Relative 5 %   Monocytes Absolute 0.4 0.1 - 1.0 K/uL   Eosinophils Relative 2 %   Eosinophils Absolute 0.1 0.0 - 0.5 K/uL   Basophils Relative 4 %   Basophils Absolute 0.3 (H) 0.0 - 0.1 K/uL   nRBC 0 0 /100 WBC   Abs Immature Granulocytes 0.00 0.00 - 0.07 K/uL   Polychromasia PRESENT    Target Cells PRESENT   Troponin I (High Sensitivity)  Result Value Ref Range   Troponin I (High Sensitivity) <2 <18 ng/L  Troponin I (High Sensitivity)  Result Value Ref Range   Troponin I (High Sensitivity) <2 <18 ng/L    EKG EKG Interpretation Date/Time:  Sunday May 04 2023 12:26:06 EDT Ventricular Rate:  82 PR Interval:  170  QRS Duration:  82 QT Interval:  388 QTC Calculation: 453 R Axis:   59  Text Interpretation: Normal sinus rhythm Normal ECG No previous  ECGs available Confirmed by Fulton Reek [phone]) on 05/04/2023 3:09:07 PM  Radiology CT Head Wo Contrast  Result Date: 05/04/2023 CLINICAL DATA:  Dizziness EXAM: CT HEAD WITHOUT CONTRAST TECHNIQUE: Contiguous axial images were obtained from the base of the skull through the vertex without intravenous contrast. RADIATION DOSE REDUCTION: This exam was performed according to the departmental dose-optimization program which includes automated exposure control, adjustment of the mA and/or kV according to patient size and/or use of iterative reconstruction technique. COMPARISON:  None Available. FINDINGS: Brain: No evidence of acute infarction, hemorrhage, hydrocephalus, extra-axial collection or mass lesion/mass effect. Vascular: No hyperdense vessel or unexpected calcification. Skull: Normal. Negative for fracture or focal lesion. Sinuses/Orbits: No acute finding. Other: None. IMPRESSION: No acute intracranial findings. Electronically Signed   By: Duanne Guess D.O.   On: 05/04/2023 13:32    Procedures Procedures    Medications Ordered in ED Medications  meclizine (ANTIVERT) tablet 50 mg (50 mg Oral Given 05/04/23 1424)  lactated ringers bolus 1,000 mL (1,000 mLs Intravenous New Bag/Given 05/04/23 1536)    ED Course/ Medical Decision Making/ A&P Clinical Course as of 05/04/23 1618  Sun May 04, 2023  1419 Patient to ED with ss/sxs as per HPI. Symptoms are recurrent, "I've had this many times before". No fever, recent illness. Feeling better now but still mildly dizzy. No nausea.  [SU]  1521 Head CT negative. Labs significant for mild hypokalemia. Meclizine given with improvement in dizziness. CO2 14 - significantly low, unexplained. No seizure activity, no excessive vomiting, no medications. Additional labs ordered, fluids. Will recheck when resulted. Patient updated on plan and agreeable to stay.  [SU]  1612 Plan: recheck CO2 after fluids, review VBG and lactic acid when resulted. Anticipate  improvement after fluids. If no improvement, discuss with medicine to consider admission for unexplained acidosis. Patient care signed out to [facility], PA-C, pending labs and review as above.  [SU]    Clinical Course User Index [SU] Elpidio Anis, PA-C                                 Medical Decision Making          Final Clinical Impression(s) / ED Diagnoses Final diagnoses:  Vertigo    Rx / DC Orders ED Discharge Orders          Ordered    meclizine (ANTIVERT) 25 MG tablet  3 times daily PRN        05/04/23 1616              Elpidio Anis, PA-C 05/04/23 1618    Laurence Spates, MD 05/08/23 [phone]

## 2023-05-04 NOTE — ED Notes (Signed)
Pt had one episode of large amount of emesis, filled up the entire emesis bag

## 2023-05-04 NOTE — ED Triage Notes (Signed)
Pt reports sudden onset of dizziness just 30 mins PTA along with chest tightness. Pt hyperventilating in triage. Denies headache but c.o nausea

## 2023-05-04 NOTE — Discharge Instructions (Addendum)
Your dizziness if felt related to vertigo. You can take Meclizine as prescribed as needed for symptoms when they recur. It is very important to follow up with your doctor for recheck if your episodes continue to occur frequently.

## 2023-05-04 NOTE — ED Provider Triage Note (Signed)
Emergency Medicine Provider Triage Evaluation Note  Stefanie Hernandez , a 37 y.o. female  was evaluated in triage.  Pt complains of acute onset of severe dizziness around noon.  Patient is vomiting in triage.  States she felt the room was spinning.  Patient states she had similar symptoms before when her hemoglobin is low.  States she has been having lower abdominal pain but it is not new for her.  Denies any fever.  States her vision is blurry.  Denies hematuria, blood in her stool.  Review of Systems  Positive: As above  Negative: As above  Physical Exam  BP 127/77   Pulse 94   Temp (!) 97.5 F (36.4 C) (Oral)   Resp (!) 28   LMP 04/08/2023 (Approximate)   SpO2 100%  Gen:   Awake, no distress  Resp:  Normal effort  MSK:   Moves extremities without difficulty Other:  No focal neurological deficits on my exam.  Medical Decision Making  Medically screening exam initiated at 12:53 PM.  Appropriate orders placed.  Stefanie Hernandez was informed that the remainder of the evaluation will be completed by another provider, this initial triage assessment does not replace that evaluation, and the importance of remaining in the ED until their evaluation is complete.    Stefanie Hernandez, [place] 05/04/23 1255
